# Patient Record
Sex: Female | Born: 1960 | Race: Black or African American | Hispanic: No
Health system: Southern US, Community
[De-identification: ages and names within clinical notes are randomized; demographics above are authoritative.]

---

## 1998-05-27 ENCOUNTER — Inpatient Hospital Stay (HOSPITAL_COMMUNITY): Admission: AD | Admit: 1998-05-27 | Discharge: 1998-05-27 | Payer: Self-pay | Admitting: *Deleted

## 1998-05-30 ENCOUNTER — Ambulatory Visit (HOSPITAL_COMMUNITY): Admission: RE | Admit: 1998-05-30 | Discharge: 1998-05-30 | Payer: Self-pay | Admitting: *Deleted

## 1998-09-08 ENCOUNTER — Emergency Department (HOSPITAL_COMMUNITY): Admission: EM | Admit: 1998-09-08 | Discharge: 1998-09-08 | Payer: Self-pay | Admitting: Emergency Medicine

## 1998-11-09 ENCOUNTER — Ambulatory Visit (HOSPITAL_COMMUNITY): Admission: AD | Admit: 1998-11-09 | Discharge: 1998-11-09 | Payer: Self-pay | Admitting: *Deleted

## 1998-12-26 ENCOUNTER — Inpatient Hospital Stay (HOSPITAL_COMMUNITY): Admission: AD | Admit: 1998-12-26 | Discharge: 1998-12-26 | Payer: Self-pay | Admitting: *Deleted

## 1999-01-03 ENCOUNTER — Encounter: Payer: Self-pay | Admitting: Emergency Medicine

## 1999-01-03 ENCOUNTER — Emergency Department (HOSPITAL_COMMUNITY): Admission: EM | Admit: 1999-01-03 | Discharge: 1999-01-03 | Payer: Self-pay | Admitting: Emergency Medicine

## 1999-02-20 ENCOUNTER — Encounter (INDEPENDENT_AMBULATORY_CARE_PROVIDER_SITE_OTHER): Payer: Self-pay

## 1999-02-20 ENCOUNTER — Inpatient Hospital Stay (HOSPITAL_COMMUNITY): Admission: RE | Admit: 1999-02-20 | Discharge: 1999-02-23 | Payer: Self-pay | Admitting: *Deleted

## 1999-02-21 ENCOUNTER — Encounter: Payer: Self-pay | Admitting: *Deleted

## 1999-03-09 ENCOUNTER — Ambulatory Visit (HOSPITAL_COMMUNITY): Admission: RE | Admit: 1999-03-09 | Discharge: 1999-03-09 | Payer: Self-pay | Admitting: *Deleted

## 1999-03-09 ENCOUNTER — Encounter: Payer: Self-pay | Admitting: *Deleted

## 1999-03-13 ENCOUNTER — Ambulatory Visit: Admission: RE | Admit: 1999-03-13 | Discharge: 1999-03-13 | Payer: Self-pay | Admitting: Gynecology

## 1999-03-19 ENCOUNTER — Encounter: Admission: RE | Admit: 1999-03-19 | Discharge: 1999-06-17 | Payer: Self-pay | Admitting: Radiation Oncology

## 1999-06-20 ENCOUNTER — Ambulatory Visit (HOSPITAL_COMMUNITY): Admission: RE | Admit: 1999-06-20 | Discharge: 1999-06-20 | Payer: Self-pay | Admitting: Hematology & Oncology

## 1999-06-20 ENCOUNTER — Encounter: Payer: Self-pay | Admitting: Hematology & Oncology

## 1999-08-06 ENCOUNTER — Encounter: Admission: RE | Admit: 1999-08-06 | Discharge: 1999-08-06 | Payer: Self-pay | Admitting: Hematology & Oncology

## 1999-08-06 ENCOUNTER — Encounter: Payer: Self-pay | Admitting: Hematology & Oncology

## 1999-08-14 ENCOUNTER — Other Ambulatory Visit: Admission: RE | Admit: 1999-08-14 | Discharge: 1999-08-14 | Payer: Self-pay | Admitting: Gynecologic Oncology

## 1999-08-14 ENCOUNTER — Ambulatory Visit: Admission: RE | Admit: 1999-08-14 | Discharge: 1999-08-14 | Payer: Self-pay | Admitting: Gynecologic Oncology

## 1999-11-08 ENCOUNTER — Ambulatory Visit (HOSPITAL_COMMUNITY): Admission: RE | Admit: 1999-11-08 | Discharge: 1999-11-08 | Payer: Self-pay | Admitting: Hematology & Oncology

## 1999-11-08 ENCOUNTER — Encounter: Payer: Self-pay | Admitting: Hematology & Oncology

## 1999-12-20 ENCOUNTER — Emergency Department (HOSPITAL_COMMUNITY): Admission: EM | Admit: 1999-12-20 | Discharge: 1999-12-20 | Payer: Self-pay | Admitting: Emergency Medicine

## 1999-12-21 ENCOUNTER — Emergency Department (HOSPITAL_COMMUNITY): Admission: EM | Admit: 1999-12-21 | Discharge: 1999-12-21 | Payer: Self-pay | Admitting: Emergency Medicine

## 2000-01-03 ENCOUNTER — Encounter: Admission: RE | Admit: 2000-01-03 | Discharge: 2000-01-03 | Payer: Self-pay | Admitting: Hematology & Oncology

## 2000-01-03 ENCOUNTER — Encounter: Payer: Self-pay | Admitting: Hematology & Oncology

## 2000-01-16 ENCOUNTER — Ambulatory Visit: Admission: RE | Admit: 2000-01-16 | Discharge: 2000-01-16 | Payer: Self-pay | Admitting: Gynecologic Oncology

## 2000-01-16 ENCOUNTER — Other Ambulatory Visit: Admission: RE | Admit: 2000-01-16 | Discharge: 2000-01-16 | Payer: Self-pay | Admitting: Gynecologic Oncology

## 2000-01-31 ENCOUNTER — Ambulatory Visit (HOSPITAL_COMMUNITY): Admission: RE | Admit: 2000-01-31 | Discharge: 2000-01-31 | Payer: Self-pay | Admitting: *Deleted

## 2000-04-22 ENCOUNTER — Ambulatory Visit (HOSPITAL_COMMUNITY): Admission: RE | Admit: 2000-04-22 | Discharge: 2000-04-22 | Payer: Self-pay | Admitting: Hematology & Oncology

## 2000-04-29 ENCOUNTER — Ambulatory Visit (HOSPITAL_COMMUNITY): Admission: RE | Admit: 2000-04-29 | Discharge: 2000-04-29 | Payer: Self-pay | Admitting: Hematology & Oncology

## 2000-04-29 ENCOUNTER — Encounter: Payer: Self-pay | Admitting: Hematology & Oncology

## 2000-05-06 ENCOUNTER — Other Ambulatory Visit: Admission: RE | Admit: 2000-05-06 | Discharge: 2000-05-06 | Payer: Self-pay | Admitting: Gynecologic Oncology

## 2000-05-06 ENCOUNTER — Ambulatory Visit: Admission: RE | Admit: 2000-05-06 | Discharge: 2000-05-06 | Payer: Self-pay | Admitting: Gynecology

## 2000-09-10 ENCOUNTER — Ambulatory Visit: Admission: RE | Admit: 2000-09-10 | Discharge: 2000-09-10 | Payer: Self-pay | Admitting: Gynecology

## 2000-09-10 ENCOUNTER — Other Ambulatory Visit: Admission: RE | Admit: 2000-09-10 | Discharge: 2000-09-10 | Payer: Self-pay | Admitting: Gynecology

## 2001-01-27 ENCOUNTER — Encounter: Payer: Self-pay | Admitting: Emergency Medicine

## 2001-01-27 ENCOUNTER — Emergency Department (HOSPITAL_COMMUNITY): Admission: EM | Admit: 2001-01-27 | Discharge: 2001-01-27 | Payer: Self-pay | Admitting: Emergency Medicine

## 2001-02-18 ENCOUNTER — Other Ambulatory Visit: Admission: RE | Admit: 2001-02-18 | Discharge: 2001-02-18 | Payer: Self-pay | Admitting: Obstetrics and Gynecology

## 2001-03-06 ENCOUNTER — Encounter: Admission: RE | Admit: 2001-03-06 | Discharge: 2001-03-06 | Payer: Self-pay | Admitting: Nephrology

## 2001-03-06 ENCOUNTER — Encounter: Payer: Self-pay | Admitting: Nephrology

## 2001-03-11 ENCOUNTER — Ambulatory Visit: Admission: RE | Admit: 2001-03-11 | Discharge: 2001-03-11 | Payer: Self-pay | Admitting: Gynecologic Oncology

## 2001-03-11 ENCOUNTER — Encounter (INDEPENDENT_AMBULATORY_CARE_PROVIDER_SITE_OTHER): Payer: Self-pay | Admitting: *Deleted

## 2001-03-11 ENCOUNTER — Other Ambulatory Visit: Admission: RE | Admit: 2001-03-11 | Discharge: 2001-03-11 | Payer: Self-pay | Admitting: Gynecologic Oncology

## 2001-04-01 ENCOUNTER — Emergency Department (HOSPITAL_COMMUNITY): Admission: EM | Admit: 2001-04-01 | Discharge: 2001-04-02 | Payer: Self-pay | Admitting: Emergency Medicine

## 2001-06-10 ENCOUNTER — Encounter (INDEPENDENT_AMBULATORY_CARE_PROVIDER_SITE_OTHER): Payer: Self-pay | Admitting: *Deleted

## 2001-06-10 ENCOUNTER — Ambulatory Visit: Admission: RE | Admit: 2001-06-10 | Discharge: 2001-06-10 | Payer: Self-pay | Admitting: Gynecologic Oncology

## 2001-06-10 ENCOUNTER — Other Ambulatory Visit: Admission: RE | Admit: 2001-06-10 | Discharge: 2001-06-10 | Payer: Self-pay | Admitting: Gynecologic Oncology

## 2001-06-19 ENCOUNTER — Emergency Department (HOSPITAL_COMMUNITY): Admission: EM | Admit: 2001-06-19 | Discharge: 2001-06-19 | Payer: Self-pay | Admitting: Emergency Medicine

## 2001-08-21 ENCOUNTER — Ambulatory Visit (HOSPITAL_COMMUNITY): Admission: RE | Admit: 2001-08-21 | Discharge: 2001-08-21 | Payer: Self-pay | Admitting: Nephrology

## 2001-08-21 ENCOUNTER — Encounter: Payer: Self-pay | Admitting: Nephrology

## 2001-08-31 ENCOUNTER — Encounter: Payer: Self-pay | Admitting: Nephrology

## 2001-08-31 ENCOUNTER — Ambulatory Visit (HOSPITAL_COMMUNITY): Admission: RE | Admit: 2001-08-31 | Discharge: 2001-08-31 | Payer: Self-pay | Admitting: Nephrology

## 2001-09-01 ENCOUNTER — Encounter (INDEPENDENT_AMBULATORY_CARE_PROVIDER_SITE_OTHER): Payer: Self-pay | Admitting: *Deleted

## 2001-09-01 ENCOUNTER — Ambulatory Visit: Admission: RE | Admit: 2001-09-01 | Discharge: 2001-09-01 | Payer: Self-pay | Admitting: Gynecology

## 2001-09-01 ENCOUNTER — Other Ambulatory Visit: Admission: RE | Admit: 2001-09-01 | Discharge: 2001-09-01 | Payer: Self-pay | Admitting: Gynecology

## 2002-03-11 ENCOUNTER — Encounter: Payer: Self-pay | Admitting: Nephrology

## 2002-03-11 ENCOUNTER — Ambulatory Visit (HOSPITAL_COMMUNITY): Admission: RE | Admit: 2002-03-11 | Discharge: 2002-03-11 | Payer: Self-pay | Admitting: Nephrology

## 2002-03-17 ENCOUNTER — Ambulatory Visit: Admission: RE | Admit: 2002-03-17 | Discharge: 2002-03-17 | Payer: Self-pay | Admitting: Gynecologic Oncology

## 2002-03-17 ENCOUNTER — Other Ambulatory Visit: Admission: RE | Admit: 2002-03-17 | Discharge: 2002-03-17 | Payer: Self-pay | Admitting: Obstetrics and Gynecology

## 2002-09-14 ENCOUNTER — Encounter (INDEPENDENT_AMBULATORY_CARE_PROVIDER_SITE_OTHER): Payer: Self-pay | Admitting: Specialist

## 2002-09-14 ENCOUNTER — Ambulatory Visit: Admission: RE | Admit: 2002-09-14 | Discharge: 2002-09-14 | Payer: Self-pay | Admitting: Gynecology

## 2002-09-14 ENCOUNTER — Other Ambulatory Visit: Admission: RE | Admit: 2002-09-14 | Discharge: 2002-09-14 | Payer: Self-pay | Admitting: Gynecology

## 2002-10-19 ENCOUNTER — Ambulatory Visit: Admission: RE | Admit: 2002-10-19 | Discharge: 2002-10-19 | Payer: Self-pay | Admitting: Gynecology

## 2002-11-26 ENCOUNTER — Encounter: Payer: Self-pay | Admitting: Nephrology

## 2002-11-26 ENCOUNTER — Encounter: Admission: RE | Admit: 2002-11-26 | Discharge: 2002-11-26 | Payer: Self-pay | Admitting: Nephrology

## 2003-01-08 ENCOUNTER — Encounter: Payer: Self-pay | Admitting: Emergency Medicine

## 2003-01-08 ENCOUNTER — Emergency Department (HOSPITAL_COMMUNITY): Admission: EM | Admit: 2003-01-08 | Discharge: 2003-01-08 | Payer: Self-pay | Admitting: Emergency Medicine

## 2003-01-10 ENCOUNTER — Encounter: Payer: Self-pay | Admitting: Emergency Medicine

## 2003-01-10 ENCOUNTER — Encounter (INDEPENDENT_AMBULATORY_CARE_PROVIDER_SITE_OTHER): Payer: Self-pay | Admitting: Cardiology

## 2003-01-10 ENCOUNTER — Observation Stay (HOSPITAL_COMMUNITY): Admission: EM | Admit: 2003-01-10 | Discharge: 2003-01-11 | Payer: Self-pay

## 2003-01-11 ENCOUNTER — Encounter: Payer: Self-pay | Admitting: Nephrology

## 2003-02-09 ENCOUNTER — Inpatient Hospital Stay (HOSPITAL_COMMUNITY): Admission: EM | Admit: 2003-02-09 | Discharge: 2003-02-11 | Payer: Self-pay | Admitting: Emergency Medicine

## 2003-02-10 ENCOUNTER — Encounter (INDEPENDENT_AMBULATORY_CARE_PROVIDER_SITE_OTHER): Payer: Self-pay | Admitting: *Deleted

## 2003-02-10 ENCOUNTER — Encounter (HOSPITAL_BASED_OUTPATIENT_CLINIC_OR_DEPARTMENT_OTHER): Payer: Self-pay | Admitting: General Surgery

## 2003-05-02 ENCOUNTER — Other Ambulatory Visit: Admission: RE | Admit: 2003-05-02 | Discharge: 2003-05-02 | Payer: Self-pay | Admitting: Obstetrics and Gynecology

## 2003-05-17 ENCOUNTER — Other Ambulatory Visit: Admission: RE | Admit: 2003-05-17 | Discharge: 2003-05-17 | Payer: Self-pay | Admitting: Radiology

## 2003-05-17 ENCOUNTER — Encounter (INDEPENDENT_AMBULATORY_CARE_PROVIDER_SITE_OTHER): Payer: Self-pay | Admitting: Radiology

## 2003-05-31 ENCOUNTER — Ambulatory Visit (HOSPITAL_COMMUNITY): Admission: RE | Admit: 2003-05-31 | Discharge: 2003-05-31 | Payer: Self-pay | Admitting: General Surgery

## 2003-05-31 ENCOUNTER — Encounter (INDEPENDENT_AMBULATORY_CARE_PROVIDER_SITE_OTHER): Payer: Self-pay | Admitting: *Deleted

## 2003-06-08 ENCOUNTER — Encounter (INDEPENDENT_AMBULATORY_CARE_PROVIDER_SITE_OTHER): Payer: Self-pay

## 2003-06-08 ENCOUNTER — Ambulatory Visit: Admission: RE | Admit: 2003-06-08 | Discharge: 2003-06-08 | Payer: Self-pay | Admitting: Gynecologic Oncology

## 2003-07-14 ENCOUNTER — Encounter: Admission: RE | Admit: 2003-07-14 | Discharge: 2003-07-14 | Payer: Self-pay | Admitting: Oncology

## 2003-07-19 ENCOUNTER — Ambulatory Visit (HOSPITAL_COMMUNITY): Admission: RE | Admit: 2003-07-19 | Discharge: 2003-07-19 | Payer: Self-pay | Admitting: General Surgery

## 2003-07-29 ENCOUNTER — Encounter: Admission: RE | Admit: 2003-07-29 | Discharge: 2003-07-29 | Payer: Self-pay | Admitting: General Surgery

## 2003-08-03 ENCOUNTER — Ambulatory Visit (HOSPITAL_COMMUNITY): Admission: RE | Admit: 2003-08-03 | Discharge: 2003-08-03 | Payer: Self-pay | Admitting: Oncology

## 2003-08-11 ENCOUNTER — Ambulatory Visit: Admission: RE | Admit: 2003-08-11 | Discharge: 2003-08-11 | Payer: Self-pay | Admitting: Oncology

## 2003-11-22 ENCOUNTER — Ambulatory Visit: Admission: RE | Admit: 2003-11-22 | Discharge: 2004-02-20 | Payer: Self-pay | Admitting: Radiation Oncology

## 2003-12-06 ENCOUNTER — Other Ambulatory Visit: Admission: RE | Admit: 2003-12-06 | Discharge: 2003-12-06 | Payer: Self-pay | Admitting: Obstetrics and Gynecology

## 2003-12-07 ENCOUNTER — Ambulatory Visit: Admission: RE | Admit: 2003-12-07 | Discharge: 2003-12-07 | Payer: Self-pay | Admitting: Oncology

## 2004-03-23 ENCOUNTER — Ambulatory Visit (HOSPITAL_COMMUNITY): Admission: RE | Admit: 2004-03-23 | Discharge: 2004-03-23 | Payer: Self-pay | Admitting: Oncology

## 2004-03-27 ENCOUNTER — Ambulatory Visit: Admission: RE | Admit: 2004-03-27 | Discharge: 2004-03-27 | Payer: Self-pay | Admitting: Radiation Oncology

## 2004-04-03 ENCOUNTER — Ambulatory Visit (HOSPITAL_COMMUNITY): Admission: RE | Admit: 2004-04-03 | Discharge: 2004-04-03 | Payer: Self-pay | Admitting: Oncology

## 2004-04-11 ENCOUNTER — Encounter (INDEPENDENT_AMBULATORY_CARE_PROVIDER_SITE_OTHER): Payer: Self-pay | Admitting: *Deleted

## 2004-04-11 ENCOUNTER — Ambulatory Visit (HOSPITAL_COMMUNITY): Admission: RE | Admit: 2004-04-11 | Discharge: 2004-04-11 | Payer: Self-pay | Admitting: Gynecology

## 2004-04-23 ENCOUNTER — Ambulatory Visit: Admission: RE | Admit: 2004-04-23 | Discharge: 2004-04-23 | Payer: Self-pay | Admitting: Gynecologic Oncology

## 2004-05-16 ENCOUNTER — Ambulatory Visit (HOSPITAL_COMMUNITY): Admission: RE | Admit: 2004-05-16 | Discharge: 2004-05-16 | Payer: Self-pay | Admitting: Oncology

## 2004-05-22 ENCOUNTER — Ambulatory Visit: Payer: Self-pay | Admitting: Oncology

## 2004-06-20 ENCOUNTER — Ambulatory Visit (HOSPITAL_COMMUNITY): Admission: RE | Admit: 2004-06-20 | Discharge: 2004-06-20 | Payer: Self-pay | Admitting: Oncology

## 2004-07-11 ENCOUNTER — Ambulatory Visit: Payer: Self-pay | Admitting: Oncology

## 2004-07-20 ENCOUNTER — Emergency Department (HOSPITAL_COMMUNITY): Admission: EM | Admit: 2004-07-20 | Discharge: 2004-07-20 | Payer: Self-pay | Admitting: Family Medicine

## 2004-07-25 ENCOUNTER — Ambulatory Visit (HOSPITAL_COMMUNITY): Admission: RE | Admit: 2004-07-25 | Discharge: 2004-07-25 | Payer: Self-pay | Admitting: Oncology

## 2004-07-31 ENCOUNTER — Ambulatory Visit: Admission: RE | Admit: 2004-07-31 | Discharge: 2004-07-31 | Payer: Self-pay | Admitting: Radiation Oncology

## 2004-08-01 ENCOUNTER — Emergency Department (HOSPITAL_COMMUNITY): Admission: EM | Admit: 2004-08-01 | Discharge: 2004-08-02 | Payer: Self-pay | Admitting: Emergency Medicine

## 2004-08-10 ENCOUNTER — Ambulatory Visit (HOSPITAL_COMMUNITY): Admission: RE | Admit: 2004-08-10 | Discharge: 2004-08-10 | Payer: Self-pay | Admitting: Oncology

## 2004-08-23 ENCOUNTER — Ambulatory Visit (HOSPITAL_COMMUNITY): Admission: RE | Admit: 2004-08-23 | Discharge: 2004-08-23 | Payer: Self-pay | Admitting: Oncology

## 2004-08-28 ENCOUNTER — Ambulatory Visit: Payer: Self-pay | Admitting: Oncology

## 2004-09-01 ENCOUNTER — Ambulatory Visit (HOSPITAL_COMMUNITY): Admission: RE | Admit: 2004-09-01 | Discharge: 2004-09-01 | Payer: Self-pay | Admitting: Oncology

## 2004-09-03 ENCOUNTER — Inpatient Hospital Stay (HOSPITAL_COMMUNITY): Admission: EM | Admit: 2004-09-03 | Discharge: 2004-09-14 | Payer: Self-pay | Admitting: Oncology

## 2004-09-03 ENCOUNTER — Ambulatory Visit: Payer: Self-pay | Admitting: Physical Medicine & Rehabilitation

## 2004-09-03 ENCOUNTER — Ambulatory Visit: Payer: Self-pay | Admitting: Oncology

## 2004-09-11 ENCOUNTER — Encounter (INDEPENDENT_AMBULATORY_CARE_PROVIDER_SITE_OTHER): Payer: Self-pay | Admitting: *Deleted

## 2004-09-14 ENCOUNTER — Inpatient Hospital Stay (HOSPITAL_COMMUNITY)
Admission: RE | Admit: 2004-09-14 | Discharge: 2004-09-19 | Payer: Self-pay | Admitting: Physical Medicine & Rehabilitation

## 2004-09-25 ENCOUNTER — Ambulatory Visit: Admission: RE | Admit: 2004-09-25 | Discharge: 2004-09-25 | Payer: Self-pay | Admitting: Gynecologic Oncology

## 2004-10-01 ENCOUNTER — Ambulatory Visit (HOSPITAL_COMMUNITY): Admission: RE | Admit: 2004-10-01 | Discharge: 2004-10-01 | Payer: Self-pay | Admitting: Oncology

## 2004-10-18 ENCOUNTER — Ambulatory Visit: Payer: Self-pay | Admitting: Oncology

## 2004-11-06 ENCOUNTER — Encounter: Admission: RE | Admit: 2004-11-06 | Discharge: 2004-11-06 | Payer: Self-pay | Admitting: Nephrology

## 2004-11-27 ENCOUNTER — Other Ambulatory Visit: Admission: RE | Admit: 2004-11-27 | Discharge: 2004-11-27 | Payer: Self-pay | Admitting: Obstetrics and Gynecology

## 2004-12-04 ENCOUNTER — Ambulatory Visit: Payer: Self-pay | Admitting: Oncology

## 2004-12-13 ENCOUNTER — Ambulatory Visit: Payer: Self-pay | Admitting: Oncology

## 2004-12-13 ENCOUNTER — Ambulatory Visit (HOSPITAL_COMMUNITY): Admission: RE | Admit: 2004-12-13 | Discharge: 2004-12-13 | Payer: Self-pay | Admitting: Oncology

## 2004-12-13 ENCOUNTER — Encounter (INDEPENDENT_AMBULATORY_CARE_PROVIDER_SITE_OTHER): Payer: Self-pay | Admitting: *Deleted

## 2005-02-06 ENCOUNTER — Ambulatory Visit (HOSPITAL_COMMUNITY): Admission: RE | Admit: 2005-02-06 | Discharge: 2005-02-06 | Payer: Self-pay | Admitting: Oncology

## 2005-02-12 ENCOUNTER — Emergency Department (HOSPITAL_COMMUNITY): Admission: EM | Admit: 2005-02-12 | Discharge: 2005-02-13 | Payer: Self-pay | Admitting: Emergency Medicine

## 2005-02-18 ENCOUNTER — Ambulatory Visit: Payer: Self-pay | Admitting: Oncology

## 2005-02-19 ENCOUNTER — Ambulatory Visit: Admission: RE | Admit: 2005-02-19 | Discharge: 2005-02-19 | Payer: Self-pay | Admitting: Gynecologic Oncology

## 2005-04-03 ENCOUNTER — Ambulatory Visit (HOSPITAL_COMMUNITY): Admission: RE | Admit: 2005-04-03 | Discharge: 2005-04-03 | Payer: Self-pay | Admitting: General Surgery

## 2005-04-03 ENCOUNTER — Ambulatory Visit (HOSPITAL_BASED_OUTPATIENT_CLINIC_OR_DEPARTMENT_OTHER): Admission: RE | Admit: 2005-04-03 | Discharge: 2005-04-03 | Payer: Self-pay | Admitting: General Surgery

## 2005-04-08 ENCOUNTER — Ambulatory Visit: Payer: Self-pay | Admitting: Oncology

## 2005-05-03 ENCOUNTER — Ambulatory Visit (HOSPITAL_BASED_OUTPATIENT_CLINIC_OR_DEPARTMENT_OTHER): Admission: RE | Admit: 2005-05-03 | Discharge: 2005-05-03 | Payer: Self-pay | Admitting: General Surgery

## 2005-05-03 ENCOUNTER — Ambulatory Visit (HOSPITAL_COMMUNITY): Admission: RE | Admit: 2005-05-03 | Discharge: 2005-05-03 | Payer: Self-pay | Admitting: General Surgery

## 2005-05-07 ENCOUNTER — Ambulatory Visit (HOSPITAL_COMMUNITY): Admission: RE | Admit: 2005-05-07 | Discharge: 2005-05-07 | Payer: Self-pay | Admitting: General Surgery

## 2005-05-24 ENCOUNTER — Ambulatory Visit: Payer: Self-pay | Admitting: Oncology

## 2005-05-28 ENCOUNTER — Ambulatory Visit (HOSPITAL_COMMUNITY): Admission: RE | Admit: 2005-05-28 | Discharge: 2005-05-28 | Payer: Self-pay | Admitting: Oncology

## 2005-05-30 ENCOUNTER — Ambulatory Visit (HOSPITAL_COMMUNITY): Admission: RE | Admit: 2005-05-30 | Discharge: 2005-05-30 | Payer: Self-pay | Admitting: Oncology

## 2005-06-10 ENCOUNTER — Ambulatory Visit: Payer: Self-pay | Admitting: Internal Medicine

## 2005-06-21 ENCOUNTER — Ambulatory Visit: Payer: Self-pay | Admitting: Internal Medicine

## 2005-07-25 ENCOUNTER — Ambulatory Visit: Payer: Self-pay | Admitting: Oncology

## 2005-08-02 ENCOUNTER — Ambulatory Visit (HOSPITAL_COMMUNITY): Admission: RE | Admit: 2005-08-02 | Discharge: 2005-08-02 | Payer: Self-pay | Admitting: Oncology

## 2005-09-19 ENCOUNTER — Ambulatory Visit (HOSPITAL_COMMUNITY): Admission: RE | Admit: 2005-09-19 | Discharge: 2005-09-19 | Payer: Self-pay | Admitting: Oncology

## 2005-09-19 ENCOUNTER — Ambulatory Visit: Payer: Self-pay | Admitting: Oncology

## 2005-10-07 ENCOUNTER — Encounter: Admission: RE | Admit: 2005-10-07 | Discharge: 2005-10-07 | Payer: Self-pay | Admitting: Nephrology

## 2005-10-14 ENCOUNTER — Ambulatory Visit (HOSPITAL_COMMUNITY): Admission: RE | Admit: 2005-10-14 | Discharge: 2005-10-14 | Payer: Self-pay | Admitting: Oncology

## 2005-11-01 LAB — CBC WITH DIFFERENTIAL/PLATELET
BASO%: 0.7 % (ref 0.0–2.0)
EOS%: 1.1 % (ref 0.0–7.0)
HCT: 32.4 % — ABNORMAL LOW (ref 34.8–46.6)
LYMPH%: 45.7 % (ref 14.0–48.0)
MCH: 33.5 pg (ref 26.0–34.0)
MCHC: 33.6 g/dL (ref 32.0–36.0)
MONO#: 0.1 10*3/uL (ref 0.1–0.9)
NEUT%: 45.9 % (ref 39.6–76.8)
RBC: 3.25 10*6/uL — ABNORMAL LOW (ref 3.70–5.32)
WBC: 2.2 10*3/uL — ABNORMAL LOW (ref 3.9–10.0)
lymph#: 1 10*3/uL (ref 0.9–3.3)

## 2005-11-04 LAB — COMPREHENSIVE METABOLIC PANEL
ALT: 19 U/L (ref 0–40)
AST: 21 U/L (ref 0–37)
CO2: 25 mEq/L (ref 19–32)
Chloride: 106 mEq/L (ref 96–112)
Creatinine, Ser: 0.8 mg/dL (ref 0.4–1.2)
Sodium: 142 mEq/L (ref 135–145)
Total Bilirubin: 0.7 mg/dL (ref 0.3–1.2)
Total Protein: 7.6 g/dL (ref 6.0–8.3)

## 2005-12-24 ENCOUNTER — Ambulatory Visit: Payer: Self-pay | Admitting: Oncology

## 2006-02-04 ENCOUNTER — Emergency Department (HOSPITAL_COMMUNITY): Admission: EM | Admit: 2006-02-04 | Discharge: 2006-02-04 | Payer: Self-pay | Admitting: Emergency Medicine

## 2006-02-06 LAB — URINALYSIS, MICROSCOPIC - CHCC
Bilirubin (Urine): NEGATIVE
Leukocyte Esterase: NEGATIVE
pH: 5 (ref 4.6–8.0)

## 2006-02-20 ENCOUNTER — Ambulatory Visit: Payer: Self-pay | Admitting: Oncology

## 2006-02-20 ENCOUNTER — Ambulatory Visit (HOSPITAL_COMMUNITY): Admission: RE | Admit: 2006-02-20 | Discharge: 2006-02-20 | Payer: Self-pay | Admitting: Oncology

## 2006-02-20 LAB — URINALYSIS, MICROSCOPIC - CHCC
Ketones: NEGATIVE mg/dL
Leukocyte Esterase: NEGATIVE
Nitrite: NEGATIVE
Specific Gravity, Urine: 1.015 (ref 1.003–1.035)
pH: 6.5 (ref 4.6–8.0)

## 2006-02-24 ENCOUNTER — Ambulatory Visit (HOSPITAL_COMMUNITY): Admission: RE | Admit: 2006-02-24 | Discharge: 2006-02-24 | Payer: Self-pay | Admitting: Urology

## 2006-02-25 LAB — CBC WITH DIFFERENTIAL/PLATELET
Basophils Absolute: 0 10*3/uL (ref 0.0–0.1)
EOS%: 1 % (ref 0.0–7.0)
Eosinophils Absolute: 0 10*3/uL (ref 0.0–0.5)
HGB: 8.4 g/dL — ABNORMAL LOW (ref 11.6–15.9)
MONO#: 0.1 10*3/uL (ref 0.1–0.9)
NEUT#: 1.2 10*3/uL — ABNORMAL LOW (ref 1.5–6.5)
RDW: 14.8 % — ABNORMAL HIGH (ref 11.3–14.5)
WBC: 2 10*3/uL — ABNORMAL LOW (ref 3.9–10.0)
lymph#: 0.7 10*3/uL — ABNORMAL LOW (ref 0.9–3.3)

## 2006-03-21 LAB — URINALYSIS, MICROSCOPIC - CHCC
Bilirubin (Urine): NEGATIVE
Ketones: NEGATIVE mg/dL
pH: 6.5 (ref 4.6–8.0)

## 2006-04-16 ENCOUNTER — Ambulatory Visit: Payer: Self-pay | Admitting: Oncology

## 2006-04-16 ENCOUNTER — Encounter (HOSPITAL_COMMUNITY): Admission: RE | Admit: 2006-04-16 | Discharge: 2006-07-15 | Payer: Self-pay | Admitting: Oncology

## 2006-04-19 ENCOUNTER — Emergency Department (HOSPITAL_COMMUNITY): Admission: EM | Admit: 2006-04-19 | Discharge: 2006-04-19 | Payer: Self-pay | Admitting: Family Medicine

## 2006-04-21 ENCOUNTER — Encounter: Admission: RE | Admit: 2006-04-21 | Discharge: 2006-04-21 | Payer: Self-pay | Admitting: Nephrology

## 2006-04-25 LAB — BASIC METABOLIC PANEL
CO2: 26 mEq/L (ref 19–32)
Chloride: 108 mEq/L (ref 96–112)
Sodium: 140 mEq/L (ref 135–145)

## 2006-04-28 ENCOUNTER — Ambulatory Visit (HOSPITAL_COMMUNITY): Admission: RE | Admit: 2006-04-28 | Discharge: 2006-04-28 | Payer: Self-pay | Admitting: Oncology

## 2006-05-31 ENCOUNTER — Inpatient Hospital Stay (HOSPITAL_COMMUNITY): Admission: EM | Admit: 2006-05-31 | Discharge: 2006-06-05 | Payer: Self-pay | Admitting: Emergency Medicine

## 2006-06-01 ENCOUNTER — Ambulatory Visit: Payer: Self-pay | Admitting: Oncology

## 2006-06-24 ENCOUNTER — Ambulatory Visit: Payer: Self-pay | Admitting: Oncology

## 2006-06-24 LAB — CBC WITH DIFFERENTIAL/PLATELET
Basophils Absolute: 0 10*3/uL (ref 0.0–0.1)
EOS%: 0.2 % (ref 0.0–7.0)
HCT: 20.8 % — ABNORMAL LOW (ref 34.8–46.6)
HGB: 7.2 g/dL — ABNORMAL LOW (ref 11.6–15.9)
MCH: 32.3 pg (ref 26.0–34.0)
MONO#: 0 10*3/uL — ABNORMAL LOW (ref 0.1–0.9)
NEUT%: 86.7 % — ABNORMAL HIGH (ref 39.6–76.8)
lymph#: 0.6 10*3/uL — ABNORMAL LOW (ref 0.9–3.3)

## 2006-06-24 LAB — BASIC METABOLIC PANEL
BUN: 13 mg/dL (ref 6–23)
CO2: 25 mEq/L (ref 19–32)
Chloride: 107 mEq/L (ref 96–112)
Creatinine, Ser: 0.7 mg/dL (ref 0.40–1.20)
Glucose, Bld: 92 mg/dL (ref 70–99)

## 2006-06-27 LAB — BASIC METABOLIC PANEL
BUN: 12 mg/dL (ref 6–23)
Potassium: 4.1 mEq/L (ref 3.5–5.3)

## 2006-06-27 LAB — CBC WITH DIFFERENTIAL/PLATELET
EOS%: 0.3 % (ref 0.0–7.0)
MCH: 32.2 pg (ref 26.0–34.0)
MCHC: 34.1 g/dL (ref 32.0–36.0)
MCV: 94.5 fL (ref 81.0–101.0)
MONO%: 0.9 % (ref 0.0–13.0)
RBC: 2.14 10*6/uL — ABNORMAL LOW (ref 3.70–5.32)
RDW: 25.9 % — ABNORMAL HIGH (ref 11.3–14.5)

## 2006-06-27 LAB — TECHNOLOGIST REVIEW

## 2006-07-02 LAB — TYPE & CROSSMATCH - CHCC

## 2006-07-09 LAB — CBC WITH DIFFERENTIAL/PLATELET
BASO%: 0 % (ref 0.0–2.0)
Eosinophils Absolute: 0 10*3/uL (ref 0.0–0.5)
HCT: 28.2 % — ABNORMAL LOW (ref 34.8–46.6)
MCHC: 33.9 g/dL (ref 32.0–36.0)
MONO#: 0 10*3/uL — ABNORMAL LOW (ref 0.1–0.9)
NEUT#: 2.7 10*3/uL (ref 1.5–6.5)
NEUT%: 81.3 % — ABNORMAL HIGH (ref 39.6–76.8)
RBC: 3.1 10*6/uL — ABNORMAL LOW (ref 3.70–5.32)
WBC: 3.4 10*3/uL — ABNORMAL LOW (ref 3.9–10.0)
lymph#: 0.6 10*3/uL — ABNORMAL LOW (ref 0.9–3.3)

## 2006-07-24 LAB — CBC WITH DIFFERENTIAL/PLATELET
BASO%: 0 % (ref 0.0–2.0)
EOS%: 0.2 % (ref 0.0–7.0)
HCT: 20.9 % — ABNORMAL LOW (ref 34.8–46.6)
LYMPH%: 14.7 % (ref 14.0–48.0)
MCH: 30.4 pg (ref 26.0–34.0)
MCHC: 33.4 g/dL (ref 32.0–36.0)
MONO%: 0.3 % (ref 0.0–13.0)
NEUT%: 84.8 % — ABNORMAL HIGH (ref 39.6–76.8)
Platelets: 55 10*3/uL — ABNORMAL LOW (ref 145–400)
RBC: 2.3 10*6/uL — ABNORMAL LOW (ref 3.70–5.32)

## 2006-07-24 LAB — TECHNOLOGIST REVIEW: Technologist Review: 3

## 2006-07-24 LAB — BASIC METABOLIC PANEL
BUN: 7 mg/dL (ref 6–23)
Calcium: 9 mg/dL (ref 8.4–10.5)
Creatinine, Ser: 0.74 mg/dL (ref 0.40–1.20)

## 2006-07-25 ENCOUNTER — Encounter (HOSPITAL_COMMUNITY): Admission: RE | Admit: 2006-07-25 | Discharge: 2006-10-23 | Payer: Self-pay | Admitting: Oncology

## 2006-07-28 LAB — TYPE & CROSSMATCH - CHCC

## 2006-08-11 ENCOUNTER — Ambulatory Visit: Payer: Self-pay | Admitting: Oncology

## 2006-08-11 LAB — CBC WITH DIFFERENTIAL/PLATELET
EOS%: 0.6 % (ref 0.0–7.0)
Eosinophils Absolute: 0 10*3/uL (ref 0.0–0.5)
LYMPH%: 31.5 % (ref 14.0–48.0)
MCH: 30.1 pg (ref 26.0–34.0)
MCV: 90.6 fL (ref 81.0–101.0)
MONO%: 7.4 % (ref 0.0–13.0)
Platelets: 81 10*3/uL — ABNORMAL LOW (ref 145–400)
RBC: 2.61 10*6/uL — ABNORMAL LOW (ref 3.70–5.32)
RDW: 19.1 % — ABNORMAL HIGH (ref 11.3–14.5)

## 2006-08-11 LAB — HOLD TUBE, BLOOD BANK

## 2006-08-18 LAB — CBC WITH DIFFERENTIAL/PLATELET
BASO%: 1.6 % (ref 0.0–2.0)
EOS%: 1.7 % (ref 0.0–7.0)
HCT: 22 % — ABNORMAL LOW (ref 34.8–46.6)
LYMPH%: 46.1 % (ref 14.0–48.0)
MCH: 31.7 pg (ref 26.0–34.0)
MCHC: 33.9 g/dL (ref 32.0–36.0)
MONO#: 0.2 10*3/uL (ref 0.1–0.9)
NEUT%: 47.3 % (ref 39.6–76.8)
Platelets: 54 10*3/uL — ABNORMAL LOW (ref 145–400)
RBC: 2.35 10*6/uL — ABNORMAL LOW (ref 3.70–5.32)
WBC: 7.3 10*3/uL (ref 3.9–10.0)
lymph#: 3.3 10*3/uL (ref 0.9–3.3)

## 2006-08-18 LAB — TECHNOLOGIST REVIEW

## 2006-08-25 LAB — CBC WITH DIFFERENTIAL/PLATELET
Basophils Absolute: 0.1 10*3/uL (ref 0.0–0.1)
EOS%: 0.4 % (ref 0.0–7.0)
Eosinophils Absolute: 0 10*3/uL (ref 0.0–0.5)
MCHC: 34.7 g/dL (ref 32.0–36.0)
MONO%: 4.1 % (ref 0.0–13.0)
NEUT%: 50.7 % (ref 39.6–76.8)
Platelets: 57 10*3/uL — ABNORMAL LOW (ref 145–400)
WBC: 5 10*3/uL (ref 3.9–10.0)
lymph#: 2.1 10*3/uL (ref 0.9–3.3)

## 2006-09-01 LAB — CBC WITH DIFFERENTIAL/PLATELET
Basophils Absolute: 0.1 10*3/uL (ref 0.0–0.1)
EOS%: 0.8 % (ref 0.0–7.0)
Eosinophils Absolute: 0 10*3/uL (ref 0.0–0.5)
HGB: 9.1 g/dL — ABNORMAL LOW (ref 11.6–15.9)
LYMPH%: 61.5 % — ABNORMAL HIGH (ref 14.0–48.0)
MCH: 32.5 pg (ref 26.0–34.0)
MCV: 92.8 fL (ref 81.0–101.0)
MONO%: 1.6 % (ref 0.0–13.0)
NEUT#: 2.1 10*3/uL (ref 1.5–6.5)
NEUT%: 35 % — ABNORMAL LOW (ref 39.6–76.8)
Platelets: 69 10*3/uL — ABNORMAL LOW (ref 145–400)
RDW: 19.8 % — ABNORMAL HIGH (ref 11.3–14.5)

## 2006-09-08 LAB — MANUAL DIFFERENTIAL
ANC (CHCC manual diff): 2.5 10*3/uL (ref 1.5–6.5)
Band Neutrophils: 1 % (ref 0–10)
Basophil: 0 % (ref 0–2)
Blasts: 7 % — ABNORMAL HIGH (ref 0–0)
EOS: 1 % (ref 0–7)
Metamyelocytes: 0 % (ref 0–0)
Myelocytes: 0 % (ref 0–0)
PLT EST: DECREASED
PROMYELO: 0 % (ref 0–0)
nRBC: 3 % — ABNORMAL HIGH (ref 0–0)

## 2006-09-08 LAB — CBC WITH DIFFERENTIAL/PLATELET
HGB: 8 g/dL — ABNORMAL LOW (ref 11.6–15.9)
MCH: 32.4 pg (ref 26.0–34.0)
MCHC: 35 g/dL (ref 32.0–36.0)
MCV: 92.6 fL (ref 81.0–101.0)
RBC: 2.46 10*6/uL — ABNORMAL LOW (ref 3.70–5.32)
RDW: 21.1 % — ABNORMAL HIGH (ref 11.3–14.5)

## 2006-09-09 ENCOUNTER — Ambulatory Visit: Payer: Self-pay | Admitting: *Deleted

## 2006-09-09 ENCOUNTER — Ambulatory Visit: Admission: RE | Admit: 2006-09-09 | Discharge: 2006-09-09 | Payer: Self-pay | Admitting: Oncology

## 2006-09-10 ENCOUNTER — Ambulatory Visit: Payer: Self-pay | Admitting: Oncology

## 2006-09-10 ENCOUNTER — Inpatient Hospital Stay (HOSPITAL_COMMUNITY): Admission: EM | Admit: 2006-09-10 | Discharge: 2006-09-12 | Payer: Self-pay | Admitting: Oncology

## 2006-09-10 LAB — CBC WITH DIFFERENTIAL/PLATELET
BASO%: 2.3 % — ABNORMAL HIGH (ref 0.0–2.0)
Basophils Absolute: 0.1 10*3/uL (ref 0.0–0.1)
EOS%: 1.5 % (ref 0.0–7.0)
HCT: 22.6 % — ABNORMAL LOW (ref 34.8–46.6)
HGB: 7.7 g/dL — ABNORMAL LOW (ref 11.6–15.9)
MCH: 31.3 pg (ref 26.0–34.0)
MONO#: 0.4 10*3/uL (ref 0.1–0.9)
RDW: 18.3 % — ABNORMAL HIGH (ref 11.3–14.5)
WBC: 4.8 10*3/uL (ref 3.9–10.0)
lymph#: 1.3 10*3/uL (ref 0.9–3.3)

## 2006-09-10 LAB — COMPREHENSIVE METABOLIC PANEL
ALT: 9 U/L (ref 0–35)
Albumin: 3.8 g/dL (ref 3.5–5.2)
Alkaline Phosphatase: 38 U/L — ABNORMAL LOW (ref 39–117)
CO2: 27 mEq/L (ref 19–32)
Potassium: 3.5 mEq/L (ref 3.5–5.3)
Sodium: 143 mEq/L (ref 135–145)
Total Bilirubin: 0.6 mg/dL (ref 0.3–1.2)
Total Protein: 6.4 g/dL (ref 6.0–8.3)

## 2006-09-15 LAB — CBC WITH DIFFERENTIAL/PLATELET
MCH: 32.3 pg (ref 26.0–34.0)
MCV: 94.1 fL (ref 81.0–101.0)
RBC: 3.05 10*6/uL — ABNORMAL LOW (ref 3.70–5.32)
RDW: 17.1 % — ABNORMAL HIGH (ref 11.3–14.5)
WBC: 6.9 10*3/uL (ref 3.9–10.0)

## 2006-09-15 LAB — MANUAL DIFFERENTIAL
Basophil: 0 % (ref 0–2)
EOS: 1 % (ref 0–7)
Myelocytes: 0 % (ref 0–0)
PLT EST: DECREASED
PROMYELO: 0 % (ref 0–0)

## 2006-09-16 ENCOUNTER — Emergency Department (HOSPITAL_COMMUNITY): Admission: EM | Admit: 2006-09-16 | Discharge: 2006-09-16 | Payer: Self-pay | Admitting: Emergency Medicine

## 2006-09-22 LAB — MANUAL DIFFERENTIAL
ALC: 2.9 10*3/uL (ref 0.9–3.3)
ANC (CHCC manual diff): 2.9 10*3/uL (ref 1.5–6.5)
Band Neutrophils: 2 % (ref 0–10)
Blasts: 22 % — ABNORMAL HIGH (ref 0–0)
LYMPH: 31 % (ref 14–49)
Metamyelocytes: 3 % — ABNORMAL HIGH (ref 0–0)
PLT EST: DECREASED
Variant Lymph: 6 % — ABNORMAL HIGH (ref 0–0)

## 2006-09-22 LAB — CBC WITH DIFFERENTIAL/PLATELET
HCT: 29.4 % — ABNORMAL LOW (ref 34.8–46.6)
HGB: 10.1 g/dL — ABNORMAL LOW (ref 11.6–15.9)
WBC: 7.8 10*3/uL (ref 3.9–10.0)

## 2006-09-25 ENCOUNTER — Inpatient Hospital Stay (HOSPITAL_COMMUNITY): Admission: RE | Admit: 2006-09-25 | Discharge: 2006-09-28 | Payer: Self-pay | Admitting: Urology

## 2006-09-29 ENCOUNTER — Emergency Department (HOSPITAL_COMMUNITY): Admission: EM | Admit: 2006-09-29 | Discharge: 2006-09-29 | Payer: Self-pay | Admitting: Emergency Medicine

## 2006-10-08 ENCOUNTER — Ambulatory Visit: Payer: Self-pay | Admitting: Oncology

## 2006-10-08 LAB — MANUAL DIFFERENTIAL
ALC: 3.4 10*3/uL — ABNORMAL HIGH (ref 0.9–3.3)
MONO: 0 % (ref 0–14)
Metamyelocytes: 2 % — ABNORMAL HIGH (ref 0–0)
Myelocytes: 0 % (ref 0–0)
PLT EST: DECREASED
PROMYELO: 0 % (ref 0–0)
Variant Lymph: 0 % (ref 0–0)

## 2006-10-08 LAB — CBC WITH DIFFERENTIAL/PLATELET
MCHC: 34.3 g/dL (ref 32.0–36.0)
Platelets: 24 10*3/uL — ABNORMAL LOW (ref 145–400)
RBC: 2.23 10*6/uL — ABNORMAL LOW (ref 3.70–5.32)
RDW: 14.9 % — ABNORMAL HIGH (ref 11.3–14.5)

## 2006-10-13 LAB — CBC WITH DIFFERENTIAL/PLATELET
Platelets: 16 10*3/uL — ABNORMAL LOW (ref 145–400)
RBC: 2.02 10*6/uL — ABNORMAL LOW (ref 3.70–5.32)
RDW: 14.9 % — ABNORMAL HIGH (ref 11.3–14.5)
WBC: 31.1 10*3/uL — ABNORMAL HIGH (ref 3.9–10.0)

## 2006-10-13 LAB — MANUAL DIFFERENTIAL
ALC: 3.1 10*3/uL (ref 0.9–3.3)
Myelocytes: 0 % (ref 0–0)
Other Cell: 0 % (ref 0–0)
PROMYELO: 0 % (ref 0–0)
SEG: 11 % — ABNORMAL LOW (ref 38–77)
Variant Lymph: 0 % (ref 0–0)

## 2006-10-17 ENCOUNTER — Encounter: Payer: Self-pay | Admitting: Oncology

## 2006-10-17 ENCOUNTER — Ambulatory Visit (HOSPITAL_COMMUNITY): Admission: RE | Admit: 2006-10-17 | Discharge: 2006-10-17 | Payer: Self-pay | Admitting: Oncology

## 2006-10-17 ENCOUNTER — Encounter (INDEPENDENT_AMBULATORY_CARE_PROVIDER_SITE_OTHER): Payer: Self-pay | Admitting: *Deleted

## 2006-10-22 LAB — CBC WITH DIFFERENTIAL/PLATELET
HCT: 22.5 % — ABNORMAL LOW (ref 34.8–46.6)
MCH: 30.2 pg (ref 26.0–34.0)
MCHC: 34.8 g/dL (ref 32.0–36.0)
MCV: 86.9 fL (ref 81.0–101.0)
Platelets: 8 10*3/uL — CL (ref 145–400)

## 2006-10-22 LAB — MANUAL DIFFERENTIAL
ALC: 1.4 10*3/uL (ref 0.9–3.3)
Basophil: 0 % (ref 0–2)
LYMPH: 3 % — ABNORMAL LOW (ref 14–49)
MONO: 5 % (ref 0–14)
Metamyelocytes: 0 % (ref 0–0)
PLT EST: DECREASED
Variant Lymph: 0 % (ref 0–0)

## 2006-10-24 LAB — MANUAL DIFFERENTIAL
EOS: 0 % (ref 0–7)
LYMPH: 5 % — ABNORMAL LOW (ref 14–49)
MONO: 3 % (ref 0–14)
Myelocytes: 0 % (ref 0–0)
Other Cell: 0 % (ref 0–0)
PLT EST: DECREASED
RBC Comments: NORMAL
SEG: 5 % — ABNORMAL LOW (ref 38–77)
Variant Lymph: 0 % (ref 0–0)

## 2006-10-24 LAB — CBC WITH DIFFERENTIAL/PLATELET
HCT: 22.6 % — ABNORMAL LOW (ref 34.8–46.6)
MCHC: 33.8 g/dL (ref 32.0–36.0)
MCV: 87 fL (ref 81.0–101.0)
Platelets: 41 10*3/uL — ABNORMAL LOW (ref 145–400)
RBC: 2.6 10*6/uL — ABNORMAL LOW (ref 3.70–5.32)

## 2006-10-28 ENCOUNTER — Encounter (HOSPITAL_COMMUNITY): Admission: RE | Admit: 2006-10-28 | Discharge: 2007-01-26 | Payer: Self-pay | Admitting: Oncology

## 2006-10-28 LAB — MANUAL DIFFERENTIAL
ALC: 7.6 10*3/uL — ABNORMAL HIGH (ref 0.9–3.3)
ANC (CHCC manual diff): 6 10*3/uL (ref 1.5–6.5)
Blasts: 74 % — ABNORMAL HIGH (ref 0–0)
MONO: 1 % (ref 0–14)
Metamyelocytes: 3 % — ABNORMAL HIGH (ref 0–0)
Myelocytes: 0 % (ref 0–0)
Other Cell: 0 % (ref 0–0)
PROMYELO: 0 % (ref 0–0)
SEG: 8 % — ABNORMAL LOW (ref 38–77)
Variant Lymph: 0 % (ref 0–0)

## 2006-10-28 LAB — CBC WITH DIFFERENTIAL/PLATELET
MCHC: 34.3 g/dL (ref 32.0–36.0)
Platelets: 24 10*3/uL — ABNORMAL LOW (ref 145–400)
RBC: 2.25 10*6/uL — ABNORMAL LOW (ref 3.70–5.32)
RDW: 15.3 % — ABNORMAL HIGH (ref 11.3–14.5)
WBC: 54.3 10*3/uL (ref 3.9–10.0)

## 2006-10-30 LAB — MANUAL DIFFERENTIAL
ALC: 13.1 10*3/uL — ABNORMAL HIGH (ref 0.9–3.3)
EOS: 0 % (ref 0–7)
MONO: 0 % (ref 0–14)
Metamyelocytes: 0 % (ref 0–0)
Myelocytes: 0 % (ref 0–0)
Other Cell: 0 % (ref 0–0)
PLT EST: DECREASED
PROMYELO: 0 % (ref 0–0)
SEG: 6 % — ABNORMAL LOW (ref 38–77)

## 2006-10-30 LAB — TYPE & CROSSMATCH - CHCC

## 2006-10-30 LAB — CBC WITH DIFFERENTIAL/PLATELET
HGB: 6.2 g/dL — CL (ref 11.6–15.9)
MCH: 29.8 pg (ref 26.0–34.0)
RBC: 2.08 10*6/uL — ABNORMAL LOW (ref 3.70–5.32)
RDW: 15 % — ABNORMAL HIGH (ref 11.3–14.5)
WBC: 48.4 10*3/uL — ABNORMAL HIGH (ref 3.9–10.0)

## 2006-11-03 LAB — CBC WITH DIFFERENTIAL/PLATELET
HCT: 25.5 % — ABNORMAL LOW (ref 34.8–46.6)
HGB: 8.7 g/dL — ABNORMAL LOW (ref 11.6–15.9)
MCV: 88.9 fL (ref 81.0–101.0)
Platelets: 9 10*3/uL — CL (ref 145–400)
RBC: 2.87 10*6/uL — ABNORMAL LOW (ref 3.70–5.32)
WBC: 51.3 10*3/uL (ref 3.9–10.0)

## 2006-11-03 LAB — MANUAL DIFFERENTIAL
ALC: 2.6 10*3/uL (ref 0.9–3.3)
Band Neutrophils: 0 % (ref 0–10)
EOS: 0 % (ref 0–7)
LYMPH: 5 % — ABNORMAL LOW (ref 14–49)
MONO: 0 % (ref 0–14)
Myelocytes: 0 % (ref 0–0)
Other Cell: 0 % (ref 0–0)
PLT EST: DECREASED
SEG: 2 % — ABNORMAL LOW (ref 38–77)
Variant Lymph: 0 % (ref 0–0)
nRBC: 0 % (ref 0–0)

## 2006-11-03 LAB — HOLD TUBE, BLOOD BANK

## 2006-11-07 LAB — MANUAL DIFFERENTIAL
ALC: 2.5 10*3/uL (ref 0.9–3.3)
EOS: 0 % (ref 0–7)
LYMPH: 4 % — ABNORMAL LOW (ref 14–49)
MONO: 0 % (ref 0–14)
Metamyelocytes: 0 % (ref 0–0)
Myelocytes: 1 % — ABNORMAL HIGH (ref 0–0)
Other Cell: 0 % (ref 0–0)
PLT EST: DECREASED
SEG: 4 % — ABNORMAL LOW (ref 38–77)
Variant Lymph: 0 % (ref 0–0)

## 2006-11-07 LAB — CBC WITH DIFFERENTIAL/PLATELET
HCT: 23.6 % — ABNORMAL LOW (ref 34.8–46.6)
MCHC: 33.3 g/dL (ref 32.0–36.0)
MCV: 86.3 fL (ref 81.0–101.0)
Platelets: 10 10*3/uL — ABNORMAL LOW (ref 145–400)
RBC: 2.73 10*6/uL — ABNORMAL LOW (ref 3.70–5.32)

## 2006-11-10 LAB — MANUAL DIFFERENTIAL
ALC: 4.6 10*3/uL — ABNORMAL HIGH (ref 0.9–3.3)
ANC (CHCC manual diff): 0 10*3/uL — CL (ref 1.5–6.5)
Blasts: 93 % — ABNORMAL HIGH (ref 0–0)
Metamyelocytes: 0 % (ref 0–0)
Myelocytes: 0 % (ref 0–0)
Other Cell: 0 % (ref 0–0)
PROMYELO: 0 % (ref 0–0)
SEG: 0 % — ABNORMAL LOW (ref 38–77)
Variant Lymph: 0 % (ref 0–0)

## 2006-11-10 LAB — CBC WITH DIFFERENTIAL/PLATELET
MCHC: 34.9 g/dL (ref 32.0–36.0)
Platelets: 18 10*3/uL — ABNORMAL LOW (ref 145–400)
RBC: 3.37 10*6/uL — ABNORMAL LOW (ref 3.70–5.32)
WBC: 65.4 10*3/uL (ref 3.9–10.0)

## 2006-11-10 LAB — TYPE & CROSSMATCH - CHCC

## 2006-11-13 LAB — CBC WITH DIFFERENTIAL/PLATELET
HCT: 28.9 % — ABNORMAL LOW (ref 34.8–46.6)
MCH: 30.6 pg (ref 26.0–34.0)
MCHC: 34.8 g/dL (ref 32.0–36.0)
MCV: 87.9 fL (ref 81.0–101.0)
Platelets: 8 10*3/uL — CL (ref 145–400)
RBC: 3.29 10*6/uL — ABNORMAL LOW (ref 3.70–5.32)

## 2006-11-13 LAB — MANUAL DIFFERENTIAL
ANC (CHCC manual diff): 0 10*3/uL — CL (ref 1.5–6.5)
Band Neutrophils: 0 % (ref 0–10)
Basophil: 0 % (ref 0–2)
Blasts: 97 % — ABNORMAL HIGH (ref 0–0)
EOS: 0 % (ref 0–7)
Myelocytes: 0 % (ref 0–0)
Other Cell: 0 % (ref 0–0)
PROMYELO: 0 % (ref 0–0)
SEG: 0 % — ABNORMAL LOW (ref 38–77)
nRBC: 0 % (ref 0–0)

## 2006-11-17 LAB — MANUAL DIFFERENTIAL
Band Neutrophils: 0 % (ref 0–10)
Basophil: 0 % (ref 0–2)
EOS: 0 % (ref 0–7)
MONO: 0 % (ref 0–14)
Other Cell: 0 % (ref 0–0)
PLT EST: DECREASED
SEG: 0 % — ABNORMAL LOW (ref 38–77)
nRBC: 1 % — ABNORMAL HIGH (ref 0–0)

## 2006-11-17 LAB — CBC WITH DIFFERENTIAL/PLATELET
HGB: 9.7 g/dL — ABNORMAL LOW (ref 11.6–15.9)
MCH: 30 pg (ref 26.0–34.0)
MCV: 85.3 fL (ref 81.0–101.0)
Platelets: 11 10*3/uL — ABNORMAL LOW (ref 145–400)
RDW: 13.3 % (ref 11.3–14.5)
WBC: 61.7 10*3/uL (ref 3.9–10.0)

## 2006-11-19 LAB — URINALYSIS, MICROSCOPIC - CHCC
Ketones: NEGATIVE mg/dL
Nitrite: NEGATIVE
Protein: 30 mg/dL
Specific Gravity, Urine: 1.02 (ref 1.003–1.035)
pH: 5 (ref 4.6–8.0)

## 2006-11-19 LAB — MANUAL DIFFERENTIAL
ALC: 3.5 10*3/uL — ABNORMAL HIGH (ref 0.9–3.3)
ANC (CHCC manual diff): 0 10*3/uL — CL (ref 1.5–6.5)
Blasts: 94 % — ABNORMAL HIGH (ref 0–0)
MONO: 0 % (ref 0–14)
Metamyelocytes: 0 % (ref 0–0)
Myelocytes: 0 % (ref 0–0)
PLT EST: DECREASED
PROMYELO: 0 % (ref 0–0)
SEG: 0 % — ABNORMAL LOW (ref 38–77)
Variant Lymph: 0 % (ref 0–0)

## 2006-11-19 LAB — HOLD TUBE, BLOOD BANK

## 2006-11-19 LAB — CBC WITH DIFFERENTIAL/PLATELET
HGB: 9.3 g/dL — ABNORMAL LOW (ref 11.6–15.9)
RBC: 3.05 10*6/uL — ABNORMAL LOW (ref 3.70–5.32)
RDW: 15.8 % — ABNORMAL HIGH (ref 11.3–14.5)
WBC: 58.1 10*3/uL (ref 3.9–10.0)

## 2006-11-24 ENCOUNTER — Ambulatory Visit: Payer: Self-pay | Admitting: Oncology

## 2006-11-24 LAB — MANUAL DIFFERENTIAL
ALC: 5.5 10*3/uL — ABNORMAL HIGH (ref 0.9–3.3)
RBC Comments: NORMAL

## 2006-11-24 LAB — CBC WITH DIFFERENTIAL/PLATELET
HGB: 8.2 g/dL — ABNORMAL LOW (ref 11.6–15.9)
MCV: 87.3 fL (ref 81.0–101.0)
Platelets: 11 10*3/uL — ABNORMAL LOW (ref 145–400)
RBC: 2.72 10*6/uL — ABNORMAL LOW (ref 3.70–5.32)
WBC: 68.2 10*3/uL (ref 3.9–10.0)

## 2006-11-25 LAB — TYPE & CROSSMATCH - CHCC

## 2006-11-27 LAB — MANUAL DIFFERENTIAL
ANC (CHCC manual diff): 0 10*3/uL — CL (ref 1.5–6.5)
Band Neutrophils: 0 % (ref 0–10)
Basophil: 0 % (ref 0–2)
Blasts: 96 % — ABNORMAL HIGH (ref 0–0)
LYMPH: 3 % — ABNORMAL LOW (ref 14–49)
Other Cell: 0 % (ref 0–0)
SEG: 0 % — ABNORMAL LOW (ref 38–77)
Variant Lymph: 0 % (ref 0–0)
nRBC: 0 % (ref 0–0)

## 2006-11-27 LAB — CBC WITH DIFFERENTIAL/PLATELET
MCHC: 34.5 g/dL (ref 32.0–36.0)
MCV: 87.2 fL (ref 81.0–101.0)
Platelets: 9 10*3/uL — CL (ref 145–400)
RBC: 3.48 10*6/uL — ABNORMAL LOW (ref 3.70–5.32)
RDW: 16.1 % — ABNORMAL HIGH (ref 11.3–14.5)

## 2006-12-01 LAB — CBC WITH DIFFERENTIAL/PLATELET
HGB: 9.2 g/dL — ABNORMAL LOW (ref 11.6–15.9)
MCH: 29.9 pg (ref 26.0–34.0)
MCHC: 34.1 g/dL (ref 32.0–36.0)
RBC: 3.08 10*6/uL — ABNORMAL LOW (ref 3.70–5.32)
RDW: 16.1 % — ABNORMAL HIGH (ref 11.3–14.5)

## 2006-12-01 LAB — HOLD TUBE, BLOOD BANK

## 2006-12-01 LAB — MANUAL DIFFERENTIAL
Blasts: 85 % — ABNORMAL HIGH (ref 0–0)
PLT EST: DECREASED
Polychromasia: NONE SEEN

## 2006-12-03 LAB — MANUAL DIFFERENTIAL
ALC: 3 10*3/uL (ref 0.9–3.3)
EOS: 0 % (ref 0–7)
LYMPH: 3 % — ABNORMAL LOW (ref 14–49)
MONO: 0 % (ref 0–14)
Metamyelocytes: 0 % (ref 0–0)
Myelocytes: 0 % (ref 0–0)
Other Cell: 0 % (ref 0–0)
PLT EST: DECREASED
Variant Lymph: 0 % (ref 0–0)

## 2006-12-03 LAB — CBC WITH DIFFERENTIAL/PLATELET
HGB: 9.3 g/dL — ABNORMAL LOW (ref 11.6–15.9)
RBC: 3.13 10*6/uL — ABNORMAL LOW (ref 3.70–5.32)
RDW: 15.8 % — ABNORMAL HIGH (ref 11.3–14.5)
WBC: 99.8 10*3/uL (ref 3.9–10.0)

## 2006-12-03 LAB — HOLD TUBE, BLOOD BANK

## 2006-12-08 LAB — MANUAL DIFFERENTIAL
ALC: 3.9 10*3/uL — ABNORMAL HIGH (ref 0.9–3.3)
ANC (CHCC manual diff): 1 10*3/uL — ABNORMAL LOW (ref 1.5–6.5)
Blasts: 93 % — ABNORMAL HIGH (ref 0–0)
MONO: 2 % (ref 0–14)
Metamyelocytes: 0 % (ref 0–0)
Myelocytes: 0 % (ref 0–0)
Other Cell: 0 % (ref 0–0)
SEG: 1 % — ABNORMAL LOW (ref 38–77)
Variant Lymph: 0 % (ref 0–0)

## 2006-12-08 LAB — CBC WITH DIFFERENTIAL/PLATELET
HCT: 24.8 % — ABNORMAL LOW (ref 34.8–46.6)
MCHC: 34.4 g/dL (ref 32.0–36.0)
Platelets: 8 10*3/uL — CL (ref 145–400)
RBC: 2.83 10*6/uL — ABNORMAL LOW (ref 3.70–5.32)
WBC: 96.6 10*3/uL (ref 3.9–10.0)

## 2006-12-11 LAB — MANUAL DIFFERENTIAL
Basophil: 0 % (ref 0–2)
EOS: 0 % (ref 0–7)
Myelocytes: 0 % (ref 0–0)
PROMYELO: 0 % (ref 0–0)

## 2006-12-11 LAB — CBC WITH DIFFERENTIAL/PLATELET
HCT: 28.1 % — ABNORMAL LOW (ref 34.8–46.6)
HGB: 9.3 g/dL — ABNORMAL LOW (ref 11.6–15.9)
MCH: 28.4 pg (ref 26.0–34.0)
MCV: 85.5 fL (ref 81.0–101.0)
Platelets: 15 10*3/uL — ABNORMAL LOW (ref 145–400)
RDW: 17.7 % — ABNORMAL HIGH (ref 11.3–14.5)
WBC: 88.6 10*3/uL (ref 3.9–10.0)

## 2006-12-11 LAB — HOLD TUBE, BLOOD BANK

## 2006-12-18 LAB — CBC WITH DIFFERENTIAL/PLATELET
HCT: 29.6 % — ABNORMAL LOW (ref 34.8–46.6)
HGB: 10.3 g/dL — ABNORMAL LOW (ref 11.6–15.9)
MCV: 83.8 fL (ref 81.0–101.0)
Platelets: 41 10*3/uL — ABNORMAL LOW (ref 145–400)
WBC: 68.3 10*3/uL (ref 3.9–10.0)

## 2006-12-18 LAB — MANUAL DIFFERENTIAL
Band Neutrophils: 0 % (ref 0–10)
EOS: 0 % (ref 0–7)
MONO: 2 % (ref 0–14)
Other Cell: 0 % (ref 0–0)
PROMYELO: 0 % (ref 0–0)
SEG: 0 % — ABNORMAL LOW (ref 38–77)
nRBC: 0 % (ref 0–0)

## 2006-12-22 LAB — MANUAL DIFFERENTIAL
ANC (CHCC manual diff): 1.5 10*3/uL (ref 1.5–6.5)
Band Neutrophils: 0 % (ref 0–10)
Basophil: 0 % (ref 0–2)
Blasts: 78 % — ABNORMAL HIGH (ref 0–0)
Metamyelocytes: 0 % (ref 0–0)
Myelocytes: 0 % (ref 0–0)
PROMYELO: 0 % (ref 0–0)
nRBC: 0 % (ref 0–0)

## 2006-12-22 LAB — CBC WITH DIFFERENTIAL/PLATELET
HGB: 9.6 g/dL — ABNORMAL LOW (ref 11.6–15.9)
MCH: 28.5 pg (ref 26.0–34.0)
RDW: 17.5 % — ABNORMAL HIGH (ref 11.3–14.5)

## 2006-12-24 LAB — CBC WITH DIFFERENTIAL/PLATELET
HCT: 28.5 % — ABNORMAL LOW (ref 34.8–46.6)
HGB: 9.6 g/dL — ABNORMAL LOW (ref 11.6–15.9)
Platelets: 38 10*3/uL — ABNORMAL LOW (ref 145–400)
WBC: 72.8 10*3/uL (ref 3.9–10.0)

## 2006-12-24 LAB — MANUAL DIFFERENTIAL
ALC: 7.3 10*3/uL — ABNORMAL HIGH (ref 0.9–3.3)
ANC (CHCC manual diff): 0.7 10*3/uL — ABNORMAL LOW (ref 1.5–6.5)
Band Neutrophils: 0 % (ref 0–10)
Blasts: 89 % — ABNORMAL HIGH (ref 0–0)
LYMPH: 10 % — ABNORMAL LOW (ref 14–49)
Metamyelocytes: 0 % (ref 0–0)
Other Cell: 0 % (ref 0–0)
SEG: 1 % — ABNORMAL LOW (ref 38–77)
Variant Lymph: 0 % (ref 0–0)

## 2006-12-25 LAB — MANUAL DIFFERENTIAL
Band Neutrophils: 0 % (ref 0–10)
Basophil: 0 % (ref 0–2)
EOS: 0 % (ref 0–7)
LYMPH: 5 % — ABNORMAL LOW (ref 14–49)
PLT EST: DECREASED
PROMYELO: 0 % (ref 0–0)
nRBC: 0 % (ref 0–0)

## 2006-12-25 LAB — CBC WITH DIFFERENTIAL/PLATELET
HGB: 9.3 g/dL — ABNORMAL LOW (ref 11.6–15.9)
MCH: 28.7 pg (ref 26.0–34.0)
MCV: 84.1 fL (ref 81.0–101.0)
RDW: 16.9 % — ABNORMAL HIGH (ref 11.3–14.5)

## 2006-12-29 LAB — CBC WITH DIFFERENTIAL/PLATELET
MCH: 28.5 pg (ref 26.0–34.0)
MCHC: 33.6 g/dL (ref 32.0–36.0)
RBC: 2.97 10*6/uL — ABNORMAL LOW (ref 3.70–5.32)
RDW: 17.3 % — ABNORMAL HIGH (ref 11.3–14.5)

## 2006-12-29 LAB — MANUAL DIFFERENTIAL
ANC (CHCC manual diff): 1 10*3/uL — ABNORMAL LOW (ref 1.5–6.5)
Basophil: 0 % (ref 0–2)
Blasts: 87 % — ABNORMAL HIGH (ref 0–0)
Metamyelocytes: 0 % (ref 0–0)
Myelocytes: 0 % (ref 0–0)
PROMYELO: 0 % (ref 0–0)

## 2007-01-01 LAB — MANUAL DIFFERENTIAL
ALC: 1.7 10*3/uL (ref 0.9–3.3)
MONO: 0 % (ref 0–14)
Myelocytes: 0 % (ref 0–0)
Other Cell: 0 % (ref 0–0)
PROMYELO: 0 % (ref 0–0)
SEG: 4 % — ABNORMAL LOW (ref 38–77)
Variant Lymph: 0 % (ref 0–0)

## 2007-01-01 LAB — TYPE & CROSSMATCH - CHCC

## 2007-01-01 LAB — CBC WITH DIFFERENTIAL/PLATELET
HGB: 10.6 g/dL — ABNORMAL LOW (ref 11.6–15.9)
Platelets: 14 10*3/uL — ABNORMAL LOW (ref 145–400)
RBC: 3.63 10*6/uL — ABNORMAL LOW (ref 3.70–5.32)
WBC: 86.7 10*3/uL (ref 3.9–10.0)

## 2007-01-02 ENCOUNTER — Emergency Department (HOSPITAL_COMMUNITY): Admission: EM | Admit: 2007-01-02 | Discharge: 2007-01-02 | Payer: Self-pay | Admitting: Emergency Medicine

## 2007-01-05 ENCOUNTER — Inpatient Hospital Stay (HOSPITAL_COMMUNITY): Admission: EM | Admit: 2007-01-05 | Discharge: 2007-01-30 | Payer: Self-pay | Admitting: Oncology

## 2007-01-05 ENCOUNTER — Ambulatory Visit: Payer: Self-pay | Admitting: Oncology

## 2007-01-06 ENCOUNTER — Encounter: Payer: Self-pay | Admitting: Oncology

## 2007-01-07 ENCOUNTER — Ambulatory Visit: Payer: Self-pay | Admitting: Oncology

## 2007-02-02 ENCOUNTER — Ambulatory Visit: Payer: Self-pay | Admitting: Oncology

## 2007-02-02 LAB — CBC WITH DIFFERENTIAL/PLATELET
Basophils Absolute: 0 10*3/uL (ref 0.0–0.1)
EOS%: 0.2 % (ref 0.0–7.0)
HCT: 25.6 % — ABNORMAL LOW (ref 34.8–46.6)
HGB: 8.9 g/dL — ABNORMAL LOW (ref 11.6–15.9)
LYMPH%: 30.8 % (ref 14.0–48.0)
MCH: 30.5 pg (ref 26.0–34.0)
MCV: 87.7 fL (ref 81.0–101.0)
MONO%: 58.5 % — ABNORMAL HIGH (ref 0.0–13.0)
NEUT%: 9.7 % — ABNORMAL LOW (ref 39.6–76.8)

## 2007-02-05 LAB — CBC WITH DIFFERENTIAL/PLATELET
Basophils Absolute: 0 10*3/uL (ref 0.0–0.1)
Eosinophils Absolute: 0 10*3/uL (ref 0.0–0.5)
HGB: 7.7 g/dL — ABNORMAL LOW (ref 11.6–15.9)
LYMPH%: 15.6 % (ref 14.0–48.0)
MONO#: 0.9 10*3/uL (ref 0.1–0.9)
NEUT#: 2.7 10*3/uL (ref 1.5–6.5)
Platelets: 9 10*3/uL — CL (ref 145–400)
RBC: 2.51 10*6/uL — ABNORMAL LOW (ref 3.70–5.32)
WBC: 4.2 10*3/uL (ref 3.9–10.0)

## 2007-02-05 LAB — TECHNOLOGIST REVIEW: Technologist Review: 1

## 2007-02-09 LAB — CBC WITH DIFFERENTIAL/PLATELET
Basophils Absolute: 0 10*3/uL (ref 0.0–0.1)
HCT: 20 % — ABNORMAL LOW (ref 34.8–46.6)
HGB: 7.1 g/dL — ABNORMAL LOW (ref 11.6–15.9)
MCH: 30.8 pg (ref 26.0–34.0)
MONO#: 1 10*3/uL — ABNORMAL HIGH (ref 0.1–0.9)
NEUT%: 28.5 % — ABNORMAL LOW (ref 39.6–76.8)
WBC: 3.9 10*3/uL (ref 3.9–10.0)
lymph#: 1.8 10*3/uL (ref 0.9–3.3)

## 2007-02-10 ENCOUNTER — Encounter (HOSPITAL_COMMUNITY): Admission: RE | Admit: 2007-02-10 | Discharge: 2007-02-26 | Payer: Self-pay | Admitting: Oncology

## 2007-02-16 LAB — MANUAL DIFFERENTIAL
Basophil: 0 % (ref 0–2)
EOS: 1 % (ref 0–7)
Metamyelocytes: 0 % (ref 0–0)
Myelocytes: 2 % — ABNORMAL HIGH (ref 0–0)
Other Cell: 0 % (ref 0–0)
PLT EST: DECREASED
PROMYELO: 0 % (ref 0–0)
SEG: 30 % — ABNORMAL LOW (ref 38–77)

## 2007-02-16 LAB — ERYTHROCYTE SEDIMENTATION RATE: Sed Rate: 58 mm/hr (ref 0–30)

## 2007-02-16 LAB — CBC WITH DIFFERENTIAL/PLATELET
HGB: 8.1 g/dL — ABNORMAL LOW (ref 11.6–15.9)
MCH: 29.7 pg (ref 26.0–34.0)
RBC: 2.74 10*6/uL — ABNORMAL LOW (ref 3.70–5.32)
RDW: 14.6 % — ABNORMAL HIGH (ref 11.3–14.5)

## 2007-02-23 LAB — MANUAL DIFFERENTIAL
ALC: 3.9 10*3/uL — ABNORMAL HIGH (ref 0.9–3.3)
ANC (CHCC manual diff): 5.5 10*3/uL (ref 1.5–6.5)
Band Neutrophils: 3 % (ref 0–10)
Blasts: 70 % — ABNORMAL HIGH (ref 0–0)
LYMPH: 12 % — ABNORMAL LOW (ref 14–49)
PROMYELO: 0 % (ref 0–0)
Variant Lymph: 0 % (ref 0–0)
nRBC: 0 % (ref 0–0)

## 2007-02-23 LAB — CBC WITH DIFFERENTIAL/PLATELET
HCT: 21.4 % — ABNORMAL LOW (ref 34.8–46.6)
HGB: 7.4 g/dL — ABNORMAL LOW (ref 11.6–15.9)
RDW: 14.7 % — ABNORMAL HIGH (ref 11.3–14.5)
WBC: 32.1 10*3/uL — ABNORMAL HIGH (ref 3.9–10.0)

## 2007-02-26 LAB — CBC WITH DIFFERENTIAL/PLATELET
HGB: 7.2 g/dL — ABNORMAL LOW (ref 11.6–15.9)
MCH: 29.3 pg (ref 26.0–34.0)
MCHC: 33.9 g/dL (ref 32.0–36.0)
RBC: 2.45 10*6/uL — ABNORMAL LOW (ref 3.70–5.32)
RDW: 14.6 % — ABNORMAL HIGH (ref 11.3–14.5)

## 2007-02-26 LAB — MANUAL DIFFERENTIAL
ANC (CHCC manual diff): 5.7 10*3/uL (ref 1.5–6.5)
Basophil: 0 % (ref 0–2)
Blasts: 85 % — ABNORMAL HIGH (ref 0–0)
EOS: 1 % (ref 0–7)
Metamyelocytes: 0 % (ref 0–0)
Myelocytes: 0 % (ref 0–0)
PROMYELO: 0 % (ref 0–0)

## 2007-02-27 LAB — TYPE & CROSSMATCH - CHCC

## 2007-03-02 LAB — MANUAL DIFFERENTIAL
ALC: 0 10*3/uL — ABNORMAL LOW (ref 0.9–3.3)
Band Neutrophils: 0 % (ref 0–10)
EOS: 0 % (ref 0–7)
MONO: 3 % (ref 0–14)
Myelocytes: 0 % (ref 0–0)
Other Cell: 0 % (ref 0–0)
PLT EST: DECREASED
PROMYELO: 0 % (ref 0–0)
SEG: 3 % — ABNORMAL LOW (ref 38–77)
Variant Lymph: 0 % (ref 0–0)
nRBC: 0 % (ref 0–0)

## 2007-03-02 LAB — CBC WITH DIFFERENTIAL/PLATELET
HGB: 9.2 g/dL — ABNORMAL LOW (ref 11.6–15.9)
MCV: 88.2 fL (ref 81.0–101.0)
Platelets: 20 10*3/uL — ABNORMAL LOW (ref 145–400)
WBC: 101.8 10*3/uL (ref 3.9–10.0)

## 2007-03-03 LAB — SEDIMENTATION RATE: Sed Rate: 64 mm/hr — ABNORMAL HIGH (ref 0–22)

## 2007-03-04 LAB — URINALYSIS, MICROSCOPIC - CHCC
Nitrite: NEGATIVE
Protein: 100 mg/dL
Specific Gravity, Urine: 1.02 (ref 1.003–1.035)

## 2007-03-05 LAB — URINE CULTURE

## 2007-03-09 LAB — CBC WITH DIFFERENTIAL/PLATELET
HGB: 7.8 g/dL — ABNORMAL LOW (ref 11.6–15.9)
RDW: 16.7 % — ABNORMAL HIGH (ref 11.3–14.5)
WBC: 192.9 10*3/uL (ref 3.9–10.0)

## 2007-03-09 LAB — MANUAL DIFFERENTIAL
ANC (CHCC manual diff): 3.9 10*3/uL (ref 1.5–6.5)
Band Neutrophils: 1 % (ref 0–10)
Basophil: 0 % (ref 0–2)
Blasts: 97 % — ABNORMAL HIGH (ref 0–0)
EOS: 0 % (ref 0–7)
LYMPH: 1 % — ABNORMAL LOW (ref 14–49)
Other Cell: 0 % (ref 0–0)
PLT EST: DECREASED
SEG: 1 % — ABNORMAL LOW (ref 38–77)
nRBC: 0 % (ref 0–0)

## 2007-03-09 LAB — ERYTHROCYTE SEDIMENTATION RATE: Sed Rate: >130 mm/hr (ref 0–30)

## 2007-03-11 LAB — CBC WITH DIFFERENTIAL/PLATELET
HCT: 20.9 % — ABNORMAL LOW (ref 34.8–46.6)
MCHC: 33.5 g/dL (ref 32.0–36.0)
RDW: 16.6 % — ABNORMAL HIGH (ref 11.3–14.5)

## 2007-03-11 LAB — MANUAL DIFFERENTIAL
ALC: 6.1 10*3/uL — ABNORMAL HIGH (ref 0.9–3.3)
ANC (CHCC manual diff): 2 10*3/uL (ref 1.5–6.5)
Basophil: 0 % (ref 0–2)
Blasts: 96 % — ABNORMAL HIGH (ref 0–0)
LYMPH: 3 % — ABNORMAL LOW (ref 14–49)
Metamyelocytes: 0 % (ref 0–0)
Myelocytes: 0 % (ref 0–0)
PLT EST: DECREASED
PROMYELO: 0 % (ref 0–0)
Variant Lymph: 0 % (ref 0–0)

## 2007-03-13 ENCOUNTER — Inpatient Hospital Stay (HOSPITAL_COMMUNITY): Admission: EM | Admit: 2007-03-13 | Discharge: 2007-03-14 | Payer: Self-pay | Admitting: Oncology

## 2007-03-13 ENCOUNTER — Ambulatory Visit: Payer: Self-pay | Admitting: Oncology

## 2007-03-23 DEATH — deceased

## 2008-04-15 IMAGING — CR DG CHEST 2V
2 series · 2 of 2 positions shown · non-contrast
Comparison: [REDACTED] chest x-ray 01/07/07.

CLINICAL DATA: Leukemia.   Followup.   Left lower lobe infiltrate.  Low grade fever.  Cough. 
 DIAGNOSTIC CHEST - 2 VIEW:

[w chest pa]
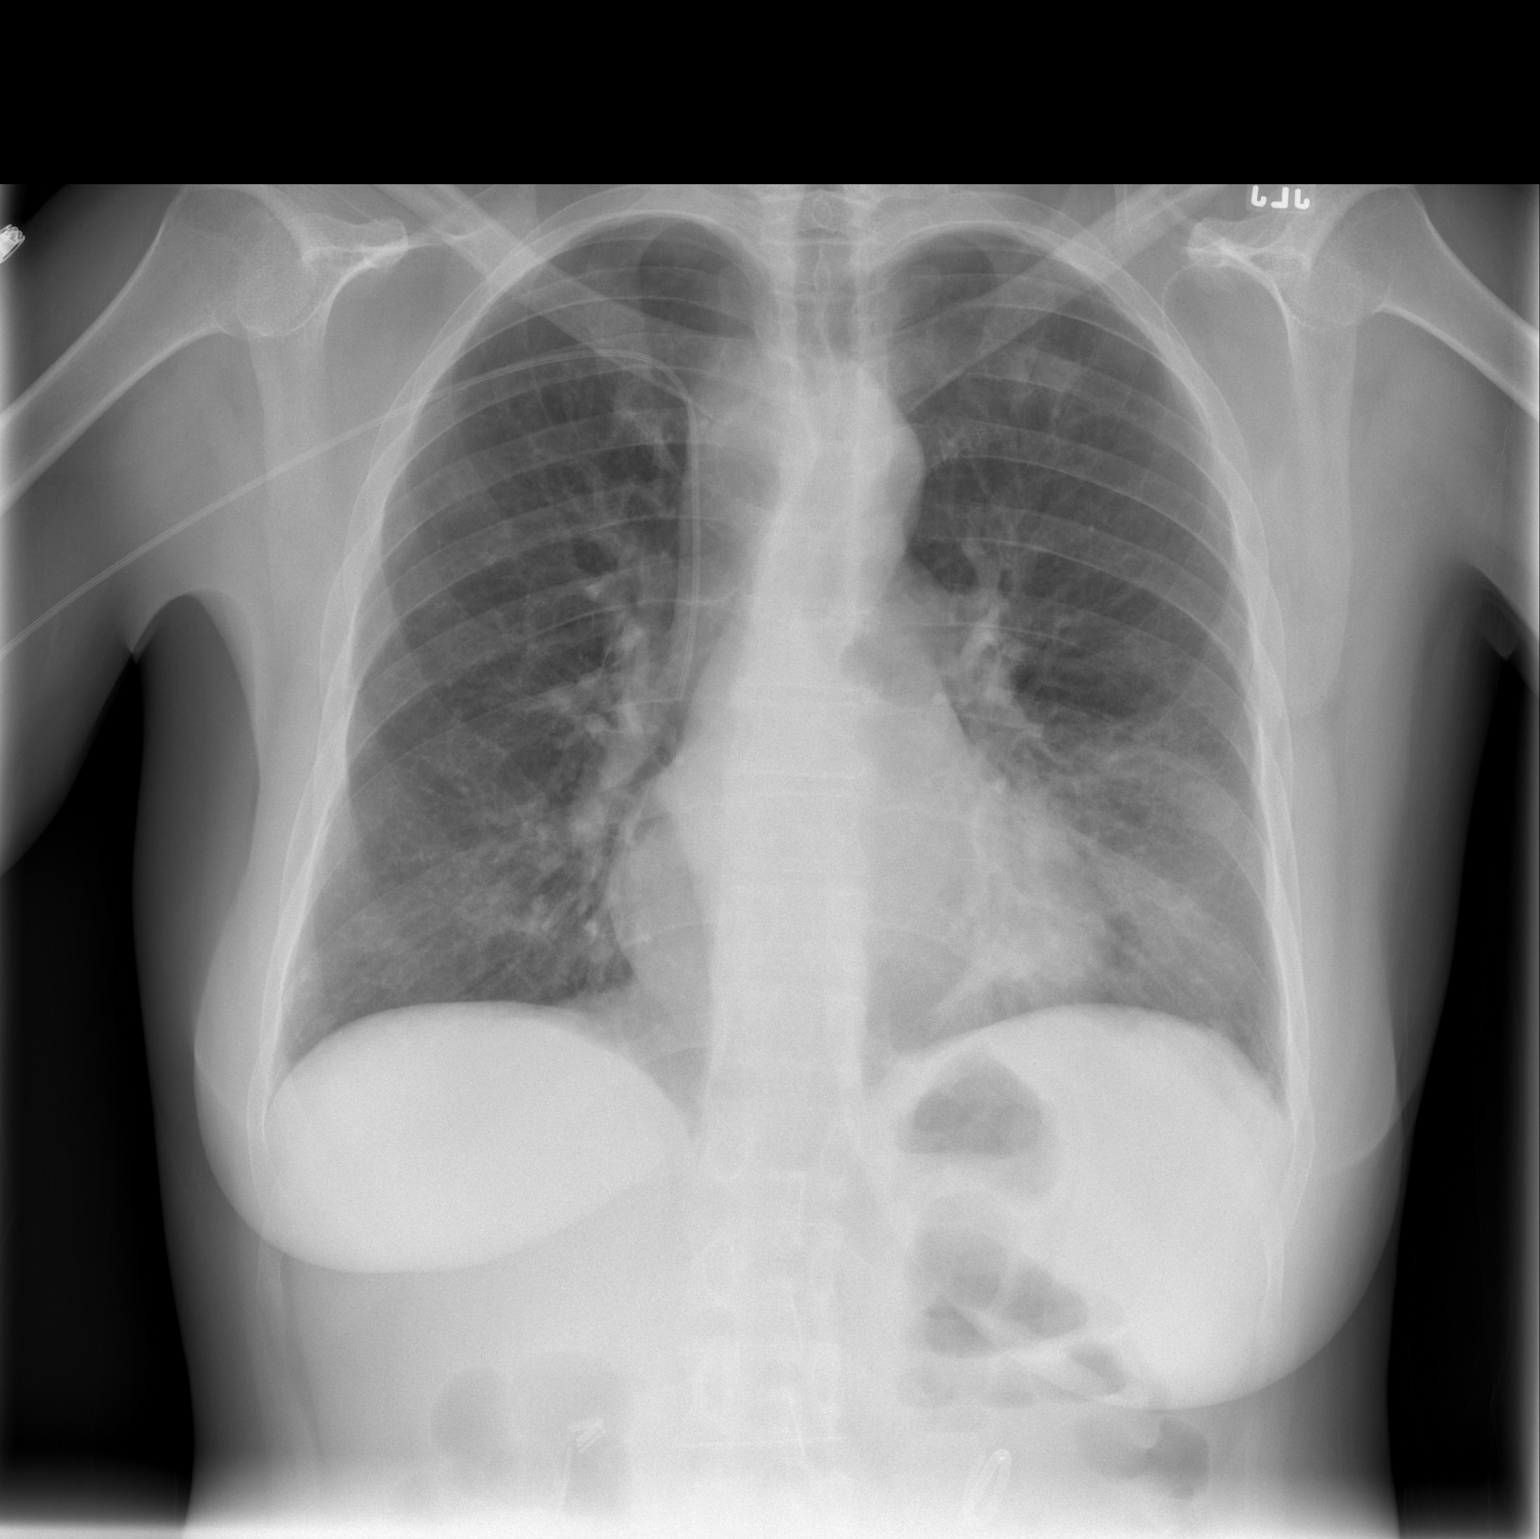

[w chest lat]
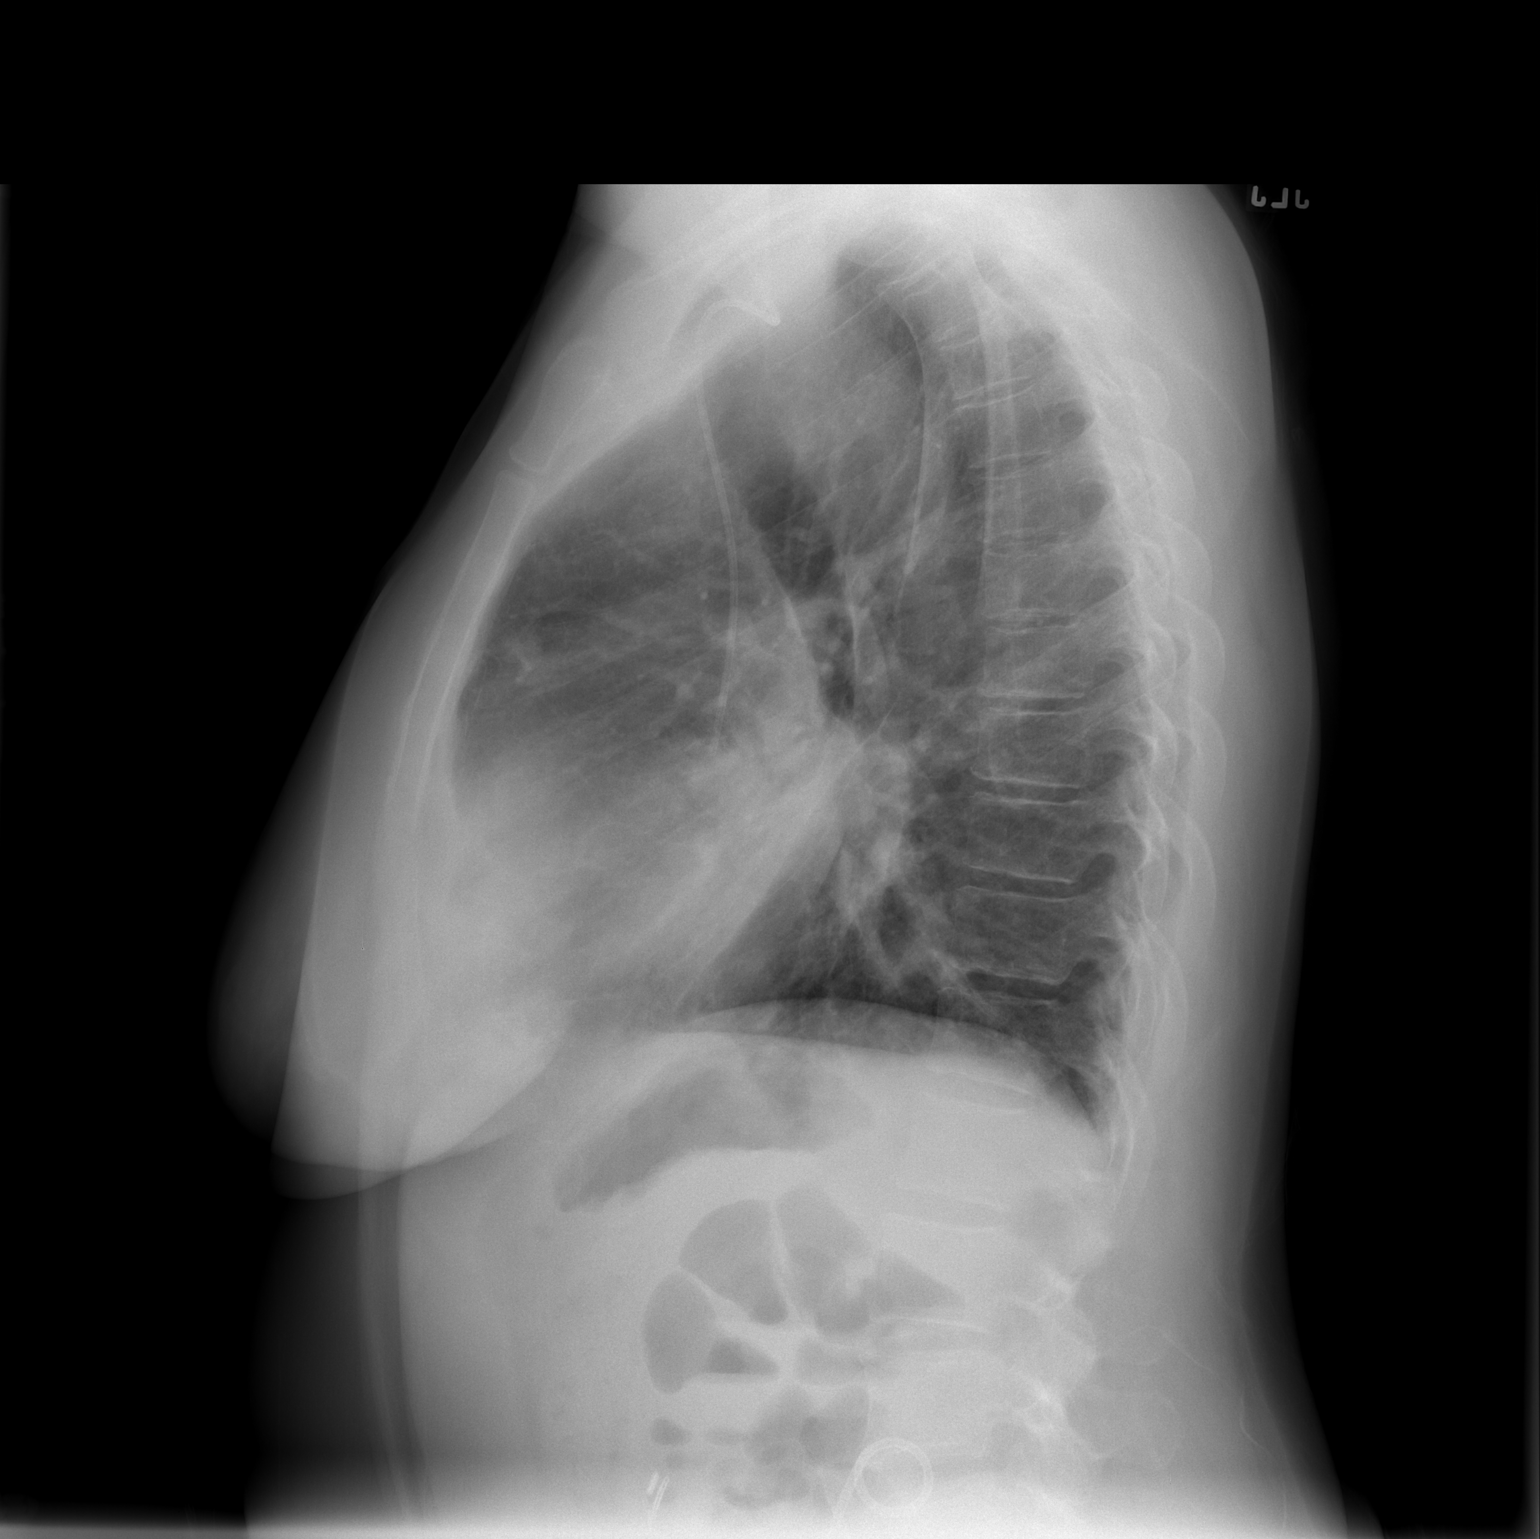

[2 of 2 positions shown; findings below may reference images not displayed]

FINDINGS: No change in patchy left lower lobe infiltrate and amorphous approximate 2 cm left apical opacity (nonspecific).  Stable diffuse prominence interstitium seen with lungs otherwise clear.  Heart size is normal.  No change in right PICC line ending at cavoatrial junction.  Again noted right-sided aortic arch.
IMPRESSION: Stable findings as described.

## 2008-04-23 IMAGING — CR DG CHEST 2V
2 series · 2 of 2 positions shown · non-contrast
Comparison: 01/11/07, 01/08/07 and 05/28/05.

CLINICAL DATA: 45-year-old female with history of breast and metastatic cervical carcinoma and leukemia.
 CHEST ? 2 VIEW:

[w chest pa]
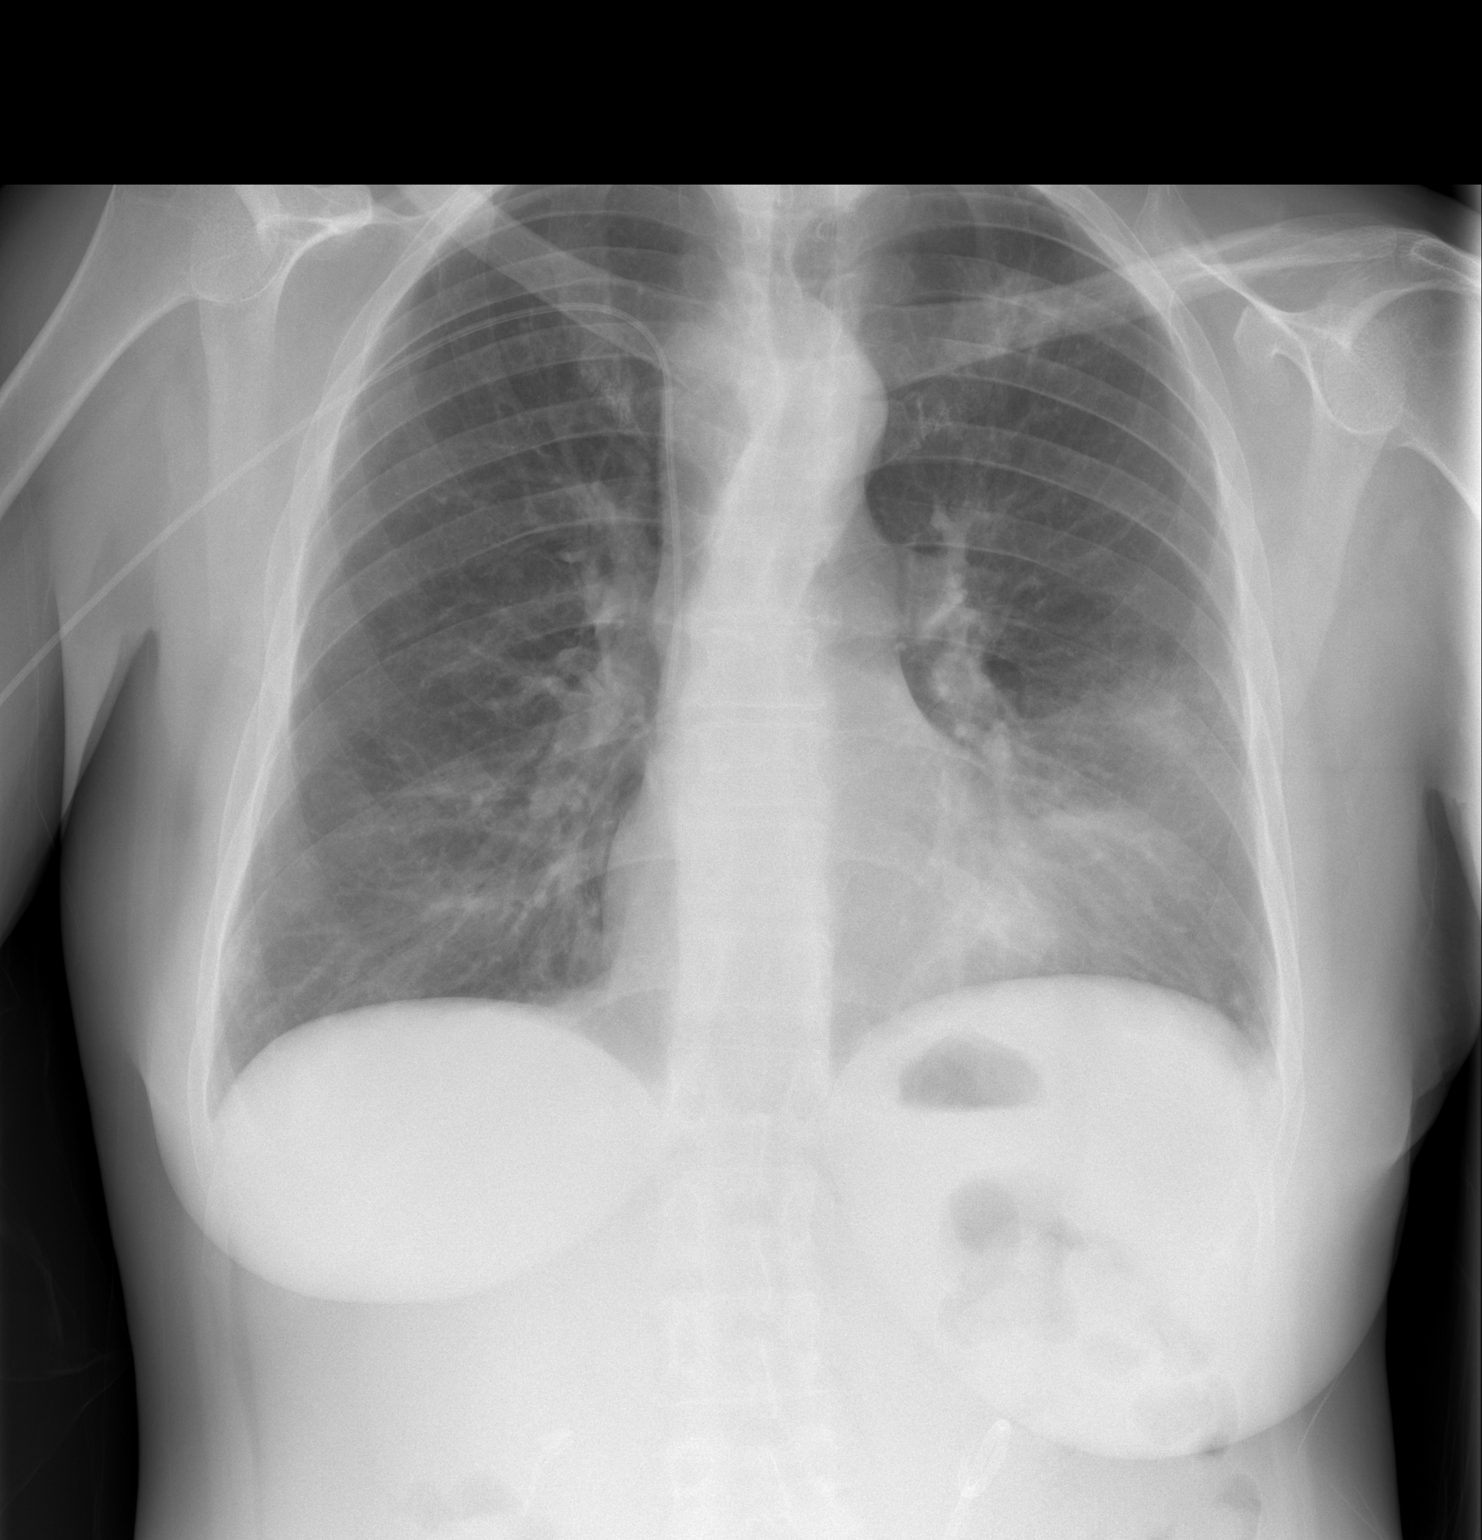

[w chest lat]
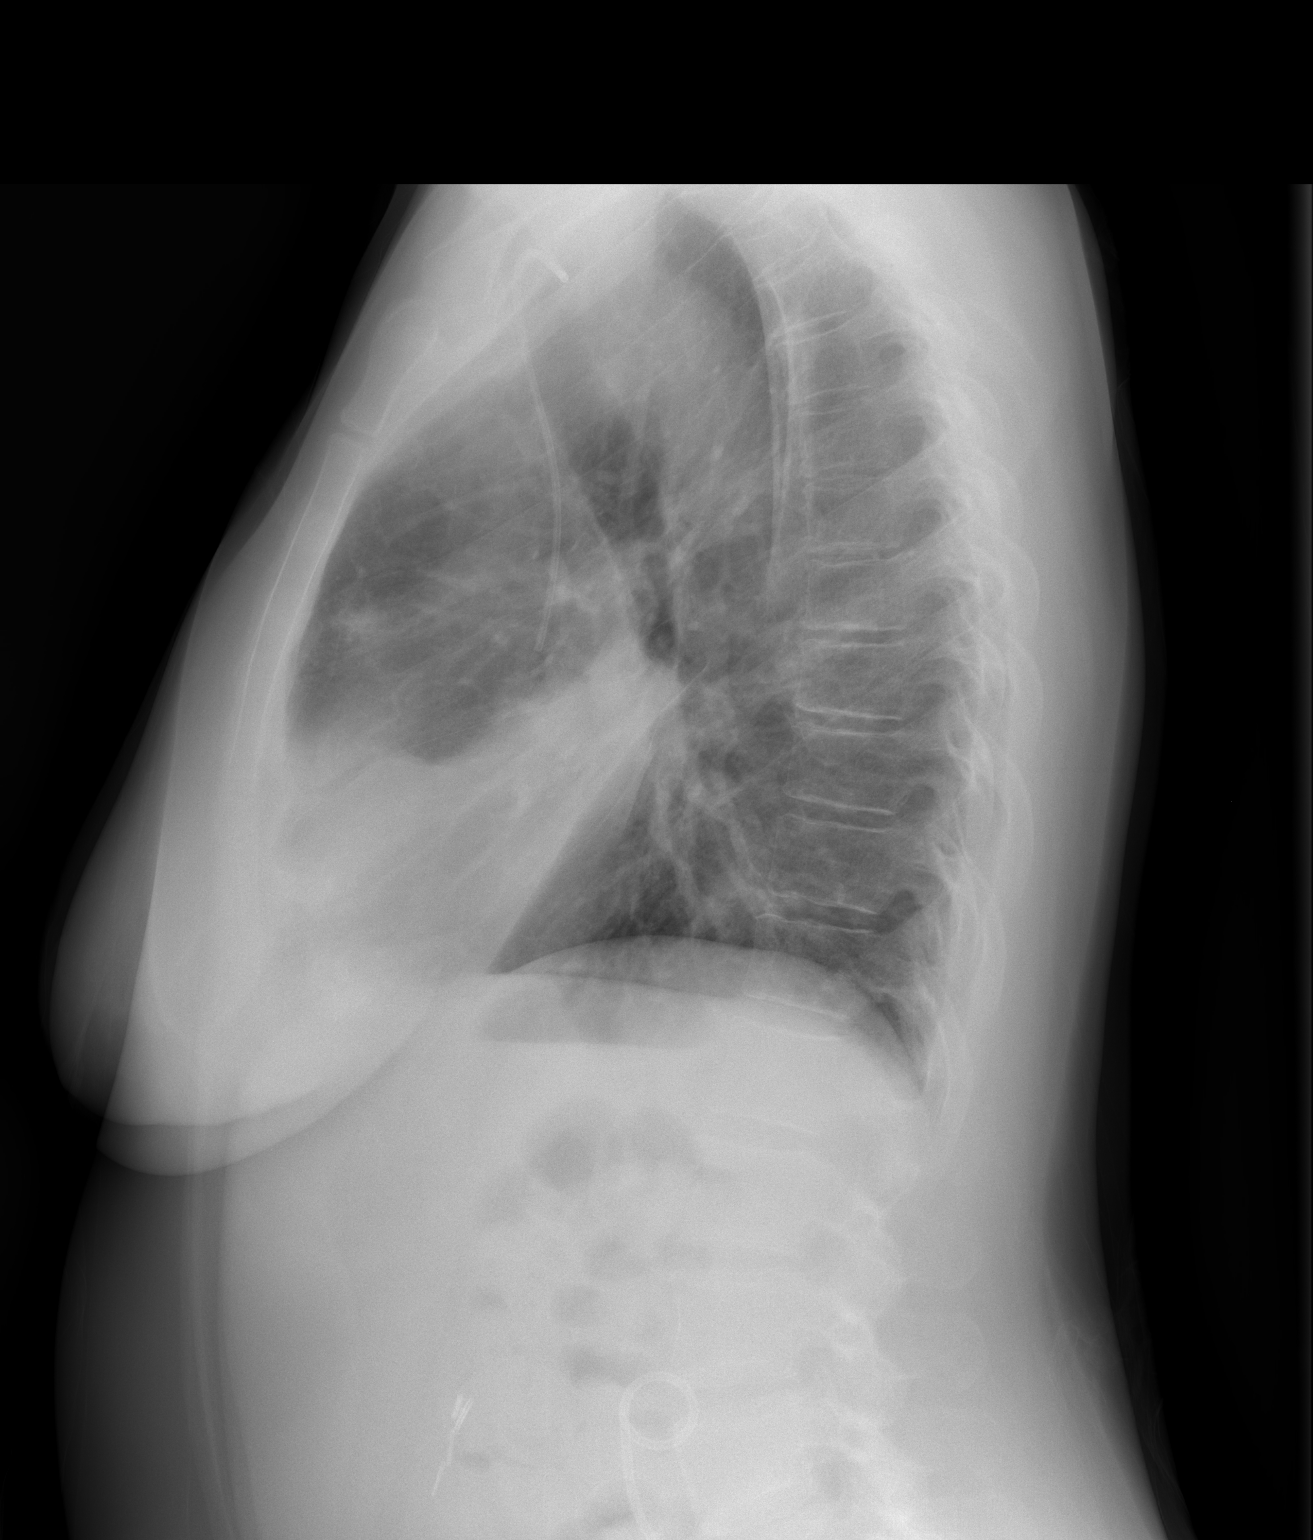

[2 of 2 positions shown; findings below may reference images not displayed]

FINDINGS: Right PICC line tip is in the SVC/RA junction.  Normal heart size.  Ill-defined nodular airspace disease persists in the left upper lobe superimposed over the clavicle and first rib.  This is stable. Increased pulmonary opacity is noted in the left lower lobe and lingula.  The left lower lobe changes are stable.  There appears to be slight progression of airspace disease and opacity in the lingula.  This could be related to pneumonia, leukemic involvement of the lungs or metastatic disease.  Right lung aeration remains stable.  No large effusion, CHF, edema or pneumothorax.  The patient is status post cholecystectomy.  Left ureteral stent is partially imaged in the abdomen.
IMPRESSION: 1. Persistent left upper lobe and left lower lobe nodular airspace disease.  
 2. Slight progression of lingula airspace opacity, as described.

## 2008-04-27 IMAGING — CR DG CHEST 1V PORT
1 series · 1 of 1 positions shown · non-contrast
Comparison: none

HISTORY: PICC line placement, leukemia

[view not recorded]
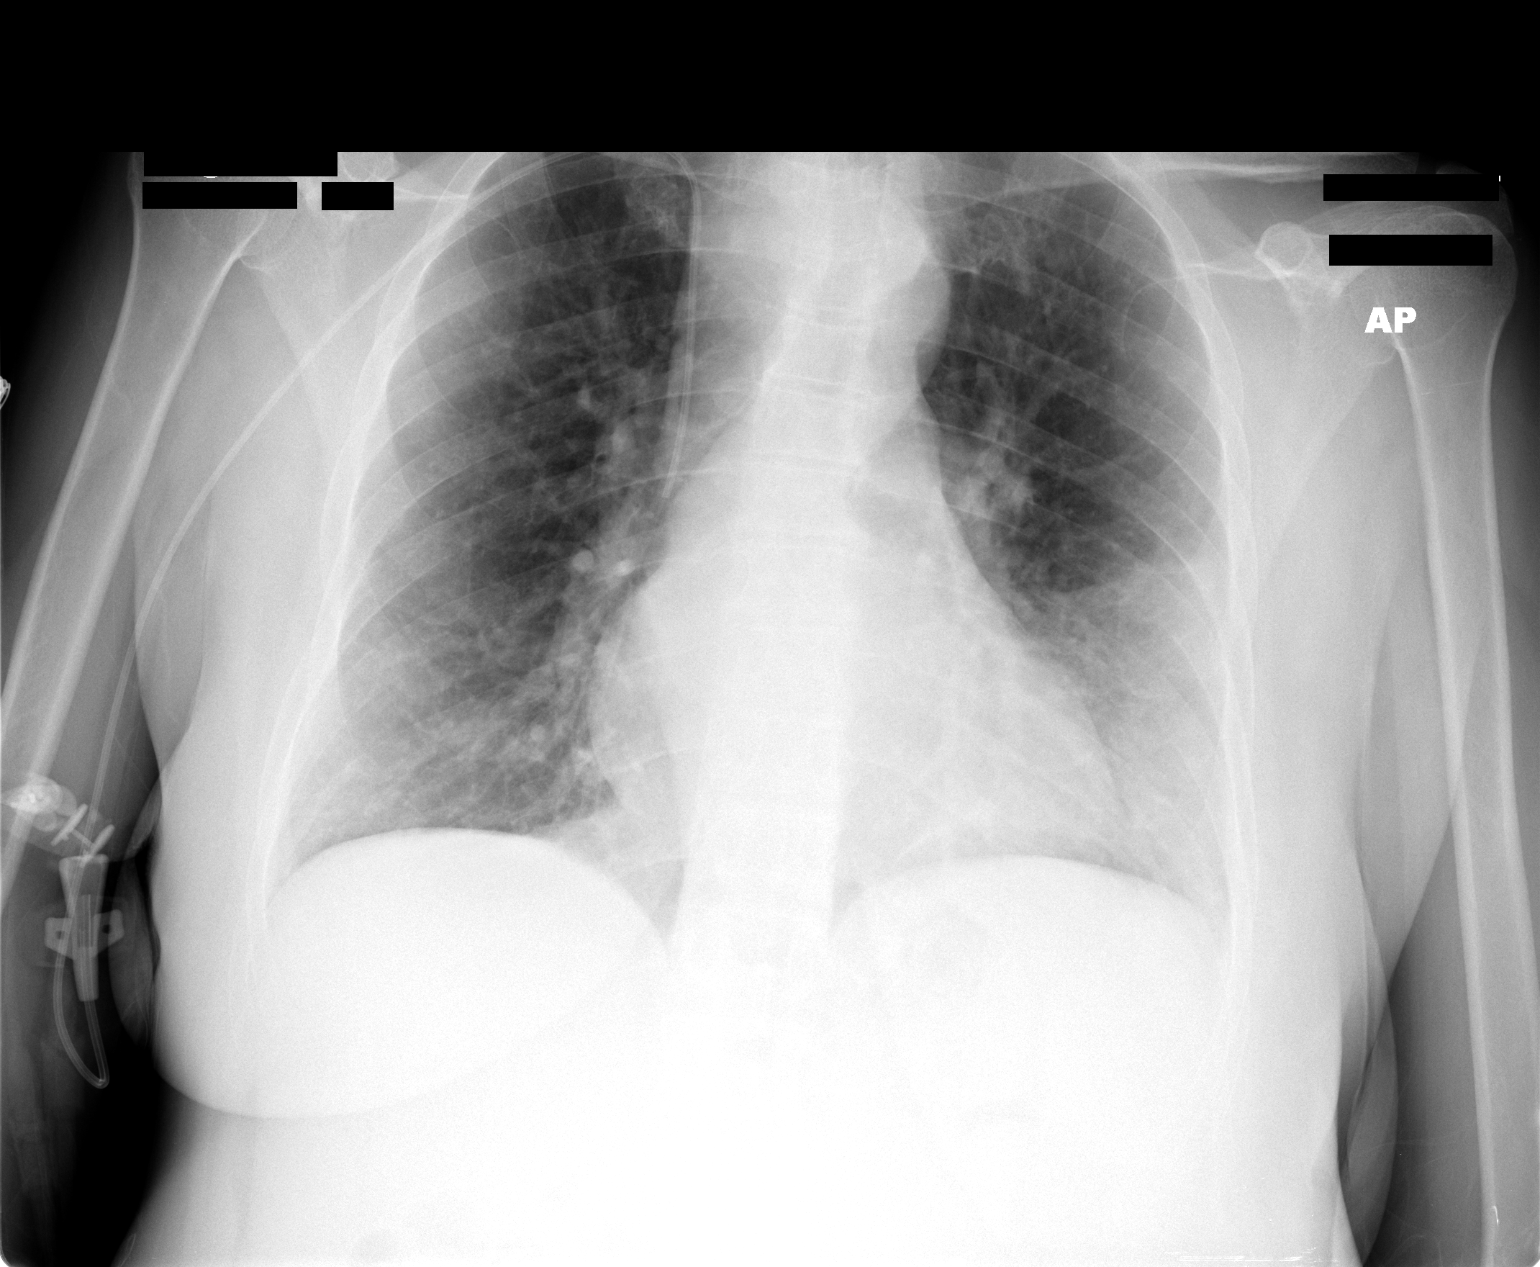

[1 of 1 positions shown; findings below may reference images not displayed]

PORTABLE CHEST ONE VIEW:

Portable exam 9955 hours compared to 01/16/2007

Right arm PICC line, tip SVC.
Minimal cardiac prominence.
Right aortic arch.
Minimal pulmonary vascular congestion.
Minimal bronchitic changes with right basilar atelectasis.
Persistent infiltrate in lower left chest within lingula, question pneumonia
versus leukemia.
Remaining lungs clear.
IMPRESSION: Tip of right arm PICC line in SVC.
Persistent lingular infiltrate.
Minimal right basilar atelectasis.

## 2010-08-11 ENCOUNTER — Encounter: Payer: Self-pay | Admitting: Oncology

## 2010-12-04 NOTE — H&P (Signed)
[name], [name] NO.:  [id number]   MEDICAL RECORD NO.:  [id number]          PATIENT TYPE:  INP   LOCATION:  1315                         FACILITY:  [facility]   PHYSICIAN:  Valentino Hue. 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  The  patient was already under hospice care at the time for her cervical  cancer (described below) and the possibility of induction treatment was  discussed with her.  At that time, the patient opted for supportive care  only.  She was treated with transfusions as needed, Hydrea, and Amicar.  It was not expected that the patient would be able to survive more than  a few weeks, but Kathleen [name], despite having significant problems with  pain and more recently fever, as well as, of course, very low counts,  has been able to not only survive but maintain enough of a functional  status and quality of life that she has reconsidered, and at this point  has agreed to induction treatment.  Accordingly, she is being admitted  for her first cycle of chemotherapy for her acute myeloid leukemia.   PAST MEDICAL HISTORY:  1. Also significant for squamous cell carcinoma of the cervix.  She is      status post hysterectomy in August of 2000.  She received radiation      and cis-platinum until October of 2000, then had recurrent disease      in September of 2005, treated with chemotherapy with very poor      tolerance, and currently the cervical carcinoma is the reason for      the hospice referral.  2. The patient also has a history of breast cancer initially biopsied      in October of 2000.  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  She had a      right lumpectomy with sentinel lymph node biopsy in November of      2004 for a  2.1-cm grade 3 tumor with no lymph node involvement.      She was treated with Cytoxan and Adriamycin followed by Taxol x8.      All chemotherapy completed May of 2005 followed by radiation and      completed in July of 2005.  3. Other problems include of course as noted pain.  4. Pancytopenia.  5. History of tobacco abuse.  6. History of cholecystectomy.  7. History of tonsillectomy and adenoidectomy.  8. History of panic attacks.  9. History of a heart murmur/  10.History of a right total hip replacement in February of 2006.   SOCIAL HISTORY:  The patient lives at home with her children, two of  whom are here today.  Her daughter, Kathleen [name], is her healthcare  power of attorney.  Kathleen [name] can be reached at [phone].  The patient is  divorced.  She is  a [employer].   FAMILY HISTORY:  The patient's father died in his 83s from a stroke.  Her mother has COPD but is alive.  She has 2 brothers and 3 sisters.  One brother died when he quit hemodialysis; one brother died with  tuberculosis; one sister died from a heart attack associated with  hemodialysis.   ALLERGIES:  She is intolerant of:  1. ASPIRIN.  2. NONSTEROIDALS.  3. CODEINE.   CURRENT MEDICATIONS:  1. Hydrea.  2. Methadone.  3. Percocet.  4. Xanax.  5. Neurontin.  6. Zofran.  7. Benicar.  8. Simethicone.  9. Caltrate.  10.Prilosec.  11.Prednisone.  12.Reglan.  13.Dulcolax.  14.Colace.  15.Senokot S.  16.Magic mouthwash.  17.Prozac.  18.Nitrofurantoin.  19.Levaquin.   REVIEW OF SYSTEMS:  For the last several days, she has had bad sweats  and fever, although she cannot tell me the exact temperature.  She has  felt weak and has been unable to walk because of weakness not because of  problems with balance.  She has had no significant headaches other than  mild sinus associated problems.  A little bit of a postnasal drip with a  cough, but nothing coming up from the lungs.  Certainly no hemoptysis  or purulence.   She has not been more short of breath than usual.  Denies  chest pain or pressure.  Has managed to have good bowel movements  despite the narcotics and has had some dysuria for which she was being  treated with nitrofurantoin prior to the admission.  Otherwise, separate  review of systems was noncontributory.   PHYSICAL EXAMINATION:  VITAL SIGNS:  On exam today, her temperature in  the office was 100.6, pulse 116 which is high for her (she usually runs  between 70 and 80), respiratory rate 20 and blood pressure 115/81.  Her  weight is 146 pounds.  HEENT:  Sclerae are not icteric.  Oropharynx - no thrush.  NECK:  No adenopathy.  LUNGS:  No crackles or wheezes with fair excursion bilaterally.  HEART:  Regular rate and rhythm.  ABDOMEN:  Soft, nontender.  Positive bowel sounds.  BREASTS:  Right breast status post lumpectomy.  No evidence of local  recurrence.  Left breast - no masses or discharge.  BACK:  No focal spinal tenderness.  EXTREMITIES:  No peripheral edema.  NEUROLOGIC:  Nonfocal.   LABORATORY DATA:  Pending.   IMPRESSION AND PLAN:  A 50 year old [place] woman with a new  diagnosis of acute myeloid leukemia established late March of 2008,  treated initially with supportive care alone, now admitted for  induction.   She will need an echocardiogram before we can start her daunorubicin.  We will start her ara-C infusion as soon as she has a PICC in place.  We  will obtain baseline cultures and continue prophylactic antibiotics with  Levaquin, Diflucan and acyclovir.  She will be started on allopurinol,  and assuming her echocardiogram is adequate, will have the standard  induction with 7:3 (ara-C/daunorubicin).   This patient has a living will in place and has healthcare power of  attorney, her daughter Kathleen [name], as noted above.  We are not writing a DNR  at this point since we are hoping the patient's acute leukemia can be  reversed.      Valentino Hue. Magrinat, M.D.   Electronically Signed     GCM/MEDQ  D:  01/05/2007  T:  01/05/2007  Job:  [id number]   cc:   Luisa Hart  Lurene Shadow, M.D.  [address]  [address]  [place]  [place] [address]   Eduardo Osier. Sharyn Lull, M.D.  Fax: [phone]   Jarome Matin, M.D.  Fax: [phone]   Billie Lade, M.D.  Fax: [phone]   Jackquline Denmark. Kyla Balzarine, M.D.  [address]  [place]  [place] [address]   Elinor Parkinson. Worthy Rancher, M.D.  Fax: [phone]   Pollyann Savoy, M.D.  Fax: [phone]

## 2010-12-04 NOTE — Op Note (Signed)
NAMEBURNIS, KASER NO.:  [id number]   MEDICAL RECORD NO.:  [id number]          PATIENT TYPE:  INP   LOCATION:  1315                         FACILITY:  [facility]   PHYSICIAN:  Lindaann Slough, M.D.  DATE OF BIRTH:  [birth year]   DATE OF PROCEDURE:  01/07/2007  DATE OF DISCHARGE:                               OPERATIVE REPORT   PREOPERATIVE DIAGNOSIS:  Left hydronephrosis secondary to  retroperitoneal adenopathy secondary to cervical cancer.   POSTOPERATIVE DIAGNOSIS:  Left hydronephrosis secondary to  retroperitoneal adenopathy secondary to cervical cancer.   PROCEDURE:  Cystoscopy, exchange of left double-J catheter.   SURGEON:  Danae Chen, M.D.   ANESTHESIA:  General.   INDICATION:  The patient is a 50 year old female who has a left double-J  catheter for hydronephrosis secondary to metastatic cervical cancer.  She is scheduled today for replacement of the double-J catheter.   DESCRIPTION OF PROCEDURE:  Under general anesthesia, the patient was  prepped and draped and placed in the dorsal lithotomy position.  A #22  panendoscope was inserted in the bladder.  There was bullous edema at  the left ureteral orifice.  There is a double-J catheter coming out of  the left ureteral orifice.  There is no stone or tumor in the bladder.  The double-J catheter was grasped with grasping forceps and pulled  through the urethra.  Then, a guide-wire was passed through the double-J  catheter and advanced all the way up into the renal pelvis and the  double-J catheter was removed.  The guide-wire was then back loaded into  the cystoscope and a 8 French 24 Polaris stent was passed over the guide-  wire.  The proximal curl of the stent is in the renal pelvis and the  loops of the stent are in the bladder.  The bladder was then emptied and  the cystoscope and guide-wire were removed.  The patient tolerated the  procedure well and left the OR in satisfactory condition to the  post  anesthesia care unit.      Lindaann Slough, M.D.  Electronically Signed     MN/MEDQ  D:  01/07/2007  T:  01/07/2007  Job:  [id number]   cc:   Valentino Hue. Magrinat, M.D.  Fax: [phone]

## 2010-12-04 NOTE — H&P (Signed)
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  She is being followed at [facility], she was seen  in the [facility] office on March 11, 2007, by  Kathleen Scale, PA-C, and Dr. Darnelle [name].  She had noted increase in fevers,  malaise, nausea, her WBC at that point showed a total white count of  202,000 with 96% blasts.  She did not wish for admit at that point, she  was started on Keflex 500 mg p.o. b.i.d., Allopurinol 300 mg per day,  and her Hydroxyurea was increased to 1 gm per day.  Over the past 48  hours, she has progressively declined, and at this point wishes to be  admitted to third floor Oncology for terminal care.  She has refused  admission to inpatient Hospice unit.  Dr. Darnelle [name], who is off today, is  aware of patient's condition, he stopped by the office this morning and  agreed to admission under Dr. Arline Asp.  Patient's prognosis is quite  poor, she is a DNR.   PAST MEDICAL HISTORY:  1. AML diagnosed March 28 of 2008, she had one cycle of chemo on January 05, 2007, at which point she actually did remarkably well but has      relapsed and has been in progressive decline.  2. Squamous cell carcinoma of the cervix S/P hysterectomy August of      2000 for which she received radiation and Cisplatinum until October      of 2000, then recurrent disease documented in September of 2005      treated with chemotherapy, poor tolerance.   Cervical carcinoma is      actually the primary reason for her Hospice referral.  3. History of breast carcinoma diagnosed in October of 2000, tumor was      triple-negative, she had a right lumpectomy with sentinel node      dissection in November of 2004 for a 2.1 cm grade III tumor without      lymph node involvement.  She received AC followed by Taxol x8,      chemo completed May of 2005 followed by XRT completed July of 2005.  4. Pancytopenia.  5. Pain control issues though on Hospice care with adequate pain      control currently.  6. History of tobacco abuse.  7. History of cholecystectomy.  8. History of tonsillectomy and adenoidectomy.  9. History of panic attacks.  10.History of a heart murmur.  11.History of a right total hip replacement in February of 2006.  12.Of  note, the patient also has systemic lupus.   FAMILY HISTORY:  The patient's father died at the age of 46 from a  stroke, her mother has COPD but is alive.  She has two brothers and  three sisters, one brother died when he quit hemodialysis, one brother  died with tuberculosis.  She has a sister, who died from heart attack  associated with hemodialysis.   SOCIAL HISTORY:  She lives at home with her children, her daughter,  Kathleen [name] is her healthcare power-of-attorney, again she is on  Hospice care, she is divorced, and she is also a Control and instrumentation engineer.   ALLERGIES:  INTOLERANT OF:  1. ASPIRIN.  2. NONSTEROIDALS IN GENERAL.  3. CODEINE.   CURRENT MEDICATIONS:  Hydroxyurea, Methadone, Percocet, Xanax,  Neurontin, Zofran, Benicar, Simethicone, Caltrate, Prilosec, prednisone,  Reglan, Dulcolax, Colace, Senokot-S-S, Magic Mouthwash, Prozac, Keflex.   REVIEW OF SYSTEMS:  As per HPI, really noncontributory to this  admission.   PHYSICAL EXAMINATION:  To be performed upon admit.   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  She will be admitted to  the floor for comfort care only, at this point I have written for her  Hydroxyurea, but patient may hold this per her request along with any of  her other medications she feels that she is not able to take at this  point.  Diet is as tolerated.  She is a Do Not Resuscitate.      Kathleen [name], M.D.  Electronically Signed     GCM/MEDQ  D:  03/13/2007  T:  03-17-07  Job:  [id number]

## 2010-12-04 NOTE — Discharge Summary (Signed)
Kathleen [name], Kathleen [name] NO.:  [id number]   MEDICAL RECORD NO.:  [id number]          PATIENT TYPE:  INP   LOCATION:  1315                         FACILITY:  [facility]   PHYSICIAN:  Valentino Hue. Magrinat, M.D.DATE OF BIRTH:  Dec 04, 1960   DATE OF ADMISSION:  01/05/2007  DATE OF DISCHARGE:  01/30/2007                               DISCHARGE SUMMARY   DISCHARGE DIAGNOSES:  1. Acute myelogenous leukemia.  2. Breast cancer.  3. Squamous cell carcinoma of the cervix, metastatic.  4. Status post hysterectomy, status post cisplatinum and radiation.  5. Status post chemotherapy with very poor tolerance, currently with      metastatic spread to lymph nodes in the mediastinum and possibly      left axilla and the liver, as well as the retroperitoneum causing      left hydronephrosis.  6. Hydronephrosis requiring stenting.  7. Pancytopenia, with anemia requiring transfusion.  8. Thrombocytopenia requiring transfusion.  9. Neutropenia and fever.  10.History of tobacco abuse.  11.Status post cholecystectomy.  12.Status post tonsillectomy and adenoidectomy.  13.History of panic attacks.  14.Status post right total hip replacement February 2006.   PROCEDURES:  Intravenous chemotherapy, transfusion of blood products, CT  scan of the chest, ureteral stent exchange, intravenous antibiotics,  echocardiogram.   HOSPITAL COURSE:  The patient was admitted on January 05, 2007, with a view  of induction therapy for her acute myelogenous leukemia, diagnosed  through bone marrow biopsy October 17, 2006.  However, before starting  intravenous treatment, it was felt to be prudent to obtain an  echocardiogram, since one of the two chemotherapy agents, namely the  daunorubicin, can be significantly cardiotoxic.  Coley an echocardiogram  January 06, 2007, and this showed a normal ejection fraction in the 65% to  75% range.  The patient had a fever at admission and was started on  intravenous antibiotics  which were modified, as her course progressed to  include antifungals and antivirals, as well as antibacterial  antibiotics.  She was severely thrombocytopenic on admission with a  platelet count of less than 10,000, which did not bump with  transfusion.  Other problems included chronic pain which was well  controlled, nausea secondary to her narcotics, and she was started on  Amicar for her severe thrombocytopenia.   The patient had a remote history of MRSA.  This was documented not to be  currently active, and so those precautions were relaxed.   By June 19, the patient's cultures were negative.  Her vitals were  stable.  Chest x-ray had showed a possible left lower lobe pneumonia,  but she was not having any other symptoms related to this, and this  indeed could be lymphangitic spread from her cervical cancer, so we  decided to start her chemotherapy on June 19, and she was started on the  continuous infusion Ara-C on that day.  She tolerated the Ara-C without  any significant complications, and on June 18 she had renal stents  changed by Dr. Brunilda Payor without event other than some hematuria.   On June 23, we started her daunorubicin  at 30 mg/m2 and she received a  total of 7 days of continuous infusion Ara-C at 100 mg/m2 and 3 days of  daunorubicin of 30 mg/m2, that dose chosen because of her prior  chemotherapy and very limited bone marrow reserve. Jenean tolerated the  daunorubicin also with minimal toxicity, no significant nausea, no  vomiting, and her very high white cell counts dropped to 1,000 by June  26 and remained at nadir until recovery by July 10, at which time it was  4.8.   She received transfusions for red cells, any time her hemoglobin dropped  below 8, and of platelets whenever the platelet count was less than  10,000, or for evidence of bleeding.  As her counts recovered in the  first week of July, her antibiotics were gradually discontinued without  her fever  recrudescing.  By July 11, her temperature was 98.3, pulse 69,  respiratory rate 20 and blood pressure 126/80.  Her room air saturation  was 100%.  Her lab work showed a white cell count of 4.5, hemoglobin 8.4  and platelets 26,000.  Her complete metabolic panel showed a sodium of  141, potassium 3.6, chloride 100, CO2 32, glucose 83, BUN 9, creatinine  0.83, normal liver function tests, and an albumin of 2.8 with a calcium  of 9.3.   On July 1, for further evaluation of the patient's fever and to follow  up the prior chest x-rays suggesting pneumonia, a CT of the chest was  obtained with contrast.  This showed a right arm PICC in place, normal  heart size, no effusions, no edema.  There was still air space disease  in the lingular segment of the left lung, which had not changed since  admission.  The thyroid was prominent.  This is not a change.  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  Left  axillary adenopathy was noted, the largest node measuring 1.6 cm, right  axillary nodes measured up to 1 cm (the patient had a right breast  cancer, initially biopsied in October 2000, triple negative, with  definitive surgery in November of 2004).   There was also mediastinal adenopathy.  More importantly, a large low-  density lesion measuring up to 4 cm was noted in the left hepatic lobe,  new from the patient's last CT scan, which had been obtained in November  2007.  Left hydronephrosis persisted, but the left kidney could not be  well-visualized.  By CT, the lung opacifications were more suspicious  for metastatic lesions, although again infection in the lingulae could  not be ruled out, but it had not changed at all one way or the other,  despite the antibiotics.   With this situation, it was felt the patient's counts and functional  status allowed her to be discharged, and on July 11 she was sent home by  cross cover.   CONDITION AT DISCHARGE:   Improved.   DISCHARGE MEDICATIONS:  1. Prozac 20 mg daily.  2. Neurontin 300 mg t.i.d.  3. Amicar 5 mg 2 tablets t.i.d.  4. Prilosec 20 mg daily.  5. Prednisone 5 mg daily.  6. Reglan 5 mg AC and h.s.  7. Xanax 2.5 mg t.i.d. as needed.  8. Percocet 1 tablet every 2 hours as needed.  9. Potassium 20 mEq daily as needed.   FOLLOW UP:  The patient had a followup already scheduled in my office  the week after discharge.  DIET:  Regular.   ACTIVITIES:  Were as tolerated.   The patient will continue to be followed by hospice after discharge.      Valentino Hue. Magrinat, M.D.  Electronically Signed     GCM/MEDQ  D:  02/13/2007  T:  02/14/2007  Job:  [id number]   cc:   Lindaann Slough, M.D.  Fax: [phone]   [facility], M.D.  [address]  [address]  [place]  [place] [address]   Eduardo Osier. Sharyn Lull, M.D.  Fax: [phone]   Jarome Matin, M.D.  Fax: [phone]   Billie Lade, M.D.  Fax: [phone]   Jackquline Denmark. Kyla Balzarine, M.D.  [address]  [place]  [place] [address]   Pollyann Savoy, M.D.  Fax: [phone]

## 2010-12-07 NOTE — Op Note (Signed)
NAME:  Kathleen [name], Kathleen [name]                          ACCOUNT NO.:  [id number]   MEDICAL RECORD NO.:  [id number]                   PATIENT TYPE:  INP   LOCATION:  5703                                 FACILITY:  [facility]   PHYSICIAN:  Leonie Man, M.D.                DATE OF BIRTH:  1960/07/24   DATE OF PROCEDURE:  02/10/2003  DATE OF DISCHARGE:                                 OPERATIVE REPORT   PREOPERATIVE DIAGNOSIS:  Chronic calculous cholecystitis.   POSTOPERATIVE DIAGNOSIS:  Chronic calculous cholecystitis.   OPERATION PERFORMED:  Laparoscopic cholecystectomy with intraoperative  cholangiogram.   SURGEON:  Leonie Man, M.D.   ASSISTANT:  Joanne Gavel, M.D.   ANESTHESIA:  General.   INDICATIONS FOR PROCEDURE:  The patient is a 50 year old woman with a  several month history of recurrent abdominal pain, primarily located in the  epigastrium and right upper quadrant associated with nausea and vomiting  following fatty food intake.  She had an abdominal ultrasound which shows  cholelithiasis.  Her CBC shows no evidence of acute inflammation.  Liver  function studies were normal.  Her most recent amylase level was mildly  elevated at 168.  The patient was admitted and brought to the operating room  now for laparoscopic cholecystectomy after the risks and potential benefits  of surgery had been thoroughly discussed, all questions answered and consent  obtained.   DESCRIPTION OF PROCEDURE:  Following the induction of satisfactory general  anesthesia with the patient positioned supinely, the abdomen was routinely  prepped and draped to be included in the sterile operative field.  Open  laparoscopy was created through a circumumbilical incision which was  deepened through the skin and subcutaneous tissue entering through the  midline.  Trocar was inserted and the peritoneal cavity inflated to 14 mmHg  pressure using carbon dioxide.  The laparoscope was introduced and visual  exploration of the abdomen carried out.  The liver edges were slightly  rounded.  Liver surface was smooth.  The gallbladder appeared not to be  acutely inflamed although there were multiple adhesions to the wall of the  gallbladder.  The anterior gastric wall and duodenal sweep appeared to be  normal and none of the small or large intestine viewed appeared to be a  normal.  Pelvic organs were not visualized.   Under direct vision, epigastric and lateral ports were placed and the  gallbladder was then grasped and retracted cephalad as multiple adhesions to  the gallbladder wall were taken down.  Dissection was carried down to the  region of the ampulla of the gallbladder where the cystic artery and cystic  duct were isolated, tracing the cystic artery up to its entry into the  gallbladder wall and the cystic duct up to the cystic duct gallbladder  junction and down to the cystic duct common duct junction. The cystic artery  was then doubly clipped and transected.  The cystic duct was clipped  proximally and opened.  I introduced a Reddick catheter into the abdomen  through a 14 gauge Angiocath and inserted into the cystic duct through which  I injected 50% Hypaque dye into the biliary system and did a fluoroscopic  cholangiogram.  The resulting cholangiogram showed free flow of contrast  into the duodenum through intrahepatic biliary ducts which were nondilated  and there were no filling defects noted.  Intrahepatic ducts were also noted  to be normal.  The cystic duct catheter was then removed and the cystic duct  was then triply clipped and transected.  The gallbladder was then dissected  free from the liver bed using electrocautery and maintaining hemostasis  throughout the course of dissection.  At the end of dissection, the liver  bed was again inspected and noted to be dry.  Sponge, instrument and sharp  counts were then verified.  The gallbladder retrieved through the umbilical   wound in an EndoCatch pouch.  This was accomplished without difficulty.  The  right upper quadrant was then thoroughly irrigated with normal saline and  aspirated.  A second sponge, instrument and sharp counts were then verified.  Wounds were then closed in layers as follows after the pneumoperitoneum was  allowed to deflate.  The umbilical wound in two layers with 0 Vicryl and a 4-  0 Monocryl, 4-0 Monocryl was used to close the epigastric and lateral ports.  All wounds were reinforced with Steri-Strips and sterile dressings were  applied.  Anesthetic was reversed and the patient removed from the operating  room to the recovery room in stable condition, having tolerated the  procedure well.                                                Leonie Man, M.D.    PB/MEDQ  D:  02/10/2003  T:  02/10/2003  Job:  [id number]   cc:   Jarome Matin, M.D.  [address] [place]  [place] [address]  Fax: [phone]

## 2010-12-07 NOTE — H&P (Signed)
Kathleen [name], [name] NO.:  [id number]   MEDICAL RECORD NO.:  [id number]          PATIENT TYPE:  IPS   LOCATION:  4025                         FACILITY:  [facility]   PHYSICIAN:  Kathleen [name], M.D.DATE OF BIRTH:  1960/09/25   DATE OF ADMISSION:  09/14/2004  DATE OF DISCHARGE:                                HISTORY & PHYSICAL   MEDICAL RECORD NUMBER:  Kathleen [name].   CHIEF COMPLAINT:  Right hip pain.   HISTORY OF PRESENT ILLNESS:  This Kathleen a 50 year old black female with a  history of cervical cancer, status post hysterectomy.  The patient had  received postoperative radiation and chemotherapy.  She also has a history  of lupus.  Subsequently, the patient developed a vascular necrosis of the  right hip and was admitted, on September 11, 2004, for a right total hip  replacement performed, by Dr. Madlyn [name]. Kathleen [name], the same day.  The patient  had a postoperative course significant for pancytopenia.  The patient  received blood transfusions.  She Kathleen on Coumadin for DVT prophylaxis.  She  Kathleen partial weightbearing on the right leg.  The patient received platelets  for significant thrombocytopenia.  Her last platelet count was 53 today.   REVIEW OF SYSTEMS:  The patient reports weight loss, weakness, joint  swelling.  Other review of systems items are in the written H&P.   PAST MEDICAL HISTORY:  1.  Lupus.  2.  Breast cancer which Kathleen in remission.  3.  Cervical cancer.  4.  Bronchitis.  5.  Depression.  6.  Heart murmur.  7.  Hysterectomy.  8.  Cholecystectomy.  9.  Right lumpectomy.  10. The patient has positive lymph nodes from breast cancer.   FAMILY HISTORY:  Noncontributory.   SOCIAL HISTORY:  The patient lives with three children.  She has a 20-year-  old child who will assist her after discharge.  The other three children are  in school but can help intermittently.   FUNCTIONAL HISTORY:  The patient was independent prior to arrival.  Currently the  patient Kathleen min assist with basic mobility, using a rolling  walker for approximately 50 feet.   MEDICATIONS PRIOR TO ARRIVAL:  Zoloft daily, Diazepam, Zofran, Compazine,  Nexium p.r.n., calcium, Norvasc.   ALLERGIES:  1.  ASPIRIN.  2.  CODEINE.  3.  IBUPROFEN.  4.  PYRIDIUM.   PHYSICAL EXAMINATION:  VITAL SIGNS:  The patient Kathleen stable and afebrile.  Vital signs are within normal limits.  HEENT:  She Kathleen normocephalic, atraumatic.  Pupils equal, round, reactive to  light and accommodation.  Oral mucosa Kathleen pink and moist.  NECK:  Supple without JVD or adenopathy.  CHEST:  Clear to auscultation bilaterally.  HEART:  Regular rate and rhythm.  EXTREMITIES:  No clubbing or cyanosis Kathleen noted but the patient has 1+ edema  of the right leg and hip area.  ABDOMEN:  Soft, nontender.  MUSCULOSKELETAL:  Motor exam reveals strength at 5/5 in the upper  extremities, 2+ to 3 out of 5 proximally in the right lower extremity and 4+  out of 5 distally.  The left leg Kathleen 4+ to 5 out of 5 throughout.  Sensory  exam Kathleen grossly intact.  Deep tendon reflexes are 2+.  NEUROLOGIC:  Cranial nerve exam Kathleen normal.  The patient cognitively Kathleen  within normal limits for judgment, orientation, and memory.  Affect Kathleen  appropriate but a bit subdued.  SKIN:  At the right hip Kathleen clean, dry, and intact with Steri-Strips.   ASSESSMENT/PLAN:  1.  Right total hip replacement secondary to avascular necrosis,      postoperative day four.  Begin comprehensive inpatient rehabilitation.      Goal Kathleen modified independent.  Estimated length of stay Kathleen approximately      7 days.  Prognosis Kathleen good.  2.  Pain management with Tylenol primarily and a muscle relaxant.  The      patient prefers not to use Vicodin.  3.  Deep vein thrombosis prophylaxis with Coumadin.  Need to observe      platelets closely.  4.  Lupus.  Continue prednisone.  5.  Pancytopenia.  Follow serial CBCs.      ZTS/MEDQ  D:  09/14/2004  T:  09/14/2004   Job:  [id number]

## 2010-12-07 NOTE — Discharge Summary (Signed)
NAMECAPRINA, WUSSOW NO.:  [id number]   MEDICAL RECORD NO.:  [id number]          PATIENT TYPE:  INP   LOCATION:  0453                         [facility]AC[facility]   PHYSICIAN:  Valentino Hue. Magrinat, M.D.DATE OF BIRTH:  [birth year]   DATE OF ADMISSION:  09/03/2004  DATE OF DISCHARGE:  09/13/2004                                 DISCHARGE SUMMARY   DISCHARGE DIAGNOSES:  1.  Avascular necrosis of the right femoral head.  2.  Adenosquamous carcinoma of the cervix, recurrent, currently controlled.  3.  Status post abdominal hysterectomy in 2000.  4.  Status post postoperative radiation with concurrent cisplatin with      progressive retroperitoneal adenopathy in September 2005.  Biopsy proven      to be metastatic recurrent cervical squamous cell carcinoma.  5.  Status post cisplatin/Carboplatin given weekly December through January      25, at which point computed tomography showed minimal progression in the      retroperitoneum with a clear pelvis.  The patient is now being followed      expectantly.  6.  History of breast cancer, status post right lumpectomy with lymph node      dissection in November 2004, for stage IIA ER/PR negative tumor treated      with adjuvant chemotherapy with no evidence of disease recurrence at      this time.  7.  Immune thrombocytopenia.  8.  Anemia requiring transfusion.  9.  History of lupus.  10. History of prior cholecystectomy, tonsillectomy and adenoidectomy.  11. Hypertension.  12. History of frequent bronchitis problems.  13. History of tobacco abuse.  14. History of a heart murmur.  15. History of panic attacks.  16. Allergies to ASPIRIN, possibly also to CODEINE.   PROCEDURES:  1.  Transfusion of blood products.  2.  Right total hip replacement on September 11, 2004.   HOSPITAL COURSE:  The patient had been complaining of some right-sided hip  pain.  A bone scan showed no obvious abnormality in that area.  Bone scan  showed  some uptake in that area, but plain films of the right hip were  negative.  MRI of the pelvis on February 11, showed avascular necrosis of  the right femoral head.  This is possibly due to her long-standing steroid  use for her lupus.  According, the patient was admitted on September 03, 2004, and consultation was obtained through Dr. Lajoyce Corners.  He felt the  patient was a good surgical candidate, but there were concerns regarding her  low counts.  At the time of admission, the patient's white cell count was  3.9, hemoglobin 9.6 and platelets 65,000.  The patient received multiple  platelet transfusions in an attempt to get her platelet count to be greater  than 100,000 and she also received packed red cells at the time of surgery.  On February 21, the patient's white cell count was 5.1.  Her hemoglobin was  11.1 and her platelet count was 96,000.  The patient then underwent the  procedure and has tolerated it remarkably  well.  Postoperatively, her  hemoglobin dropped to 9.6 and then 9.1, and she is being transfused again at  the time of this dictation.   Anticoagulation is routine after hip replacement and this patient should  receive Coumadin despite her thrombocytopenia so long as her platelet count  remains above 50,000.  At the time of this dictation, her white cell count  is 9, hemoglobin 9.1 and her platelet count 72,000.  Other labs include a  prothrombin time this morning of 1.3 with Coumadin being adjusted by  pharmacy and a creatinine of 0.9 with a BUN of 12.   CONDITION ON DISCHARGE:  Improved.   DISCHARGE MEDICATIONS:  1.  Protonix 40 mg daily.  2.  Risperdal 0.25 mg twice a day.  3.  Zoloft 100 mg at bedtime.  4.  Caltrate twice daily.  5.  Colace two tablets 100 mg twice a day.  6.  Trinsicon three times a day after meals.  7.  MiraLax 17 g daily in 8 ounces of fluid.  8.  Aranesp 100 mcg subcutaneously every Tuesday.  9.  Restoril 15 mg at bedtime as needed.  10.  Vicodin one tablet every 4 hours as needed for pain.  11. Tylenol one to two tablets every 4 hours as needed for pain.  12. Robaxin 500 mg every 6 hours as needed.  13. Reglan 10 mg before meals as needed for nausea.  14. Celebrex 100 mg twice daily.   ACTIVITY:  As per orthopedics and rehabilitation.   DIET:  Unrestricted.   WOUND CARE:  As per Dr. Nilsa Nutting service.   SPECIAL INSTRUCTIONS:  The patient should have her prothrombin time adjusted  by pharmacy.  She should have a CBC checked at least weekly while in the  hospital with transfusion as needed (generally for hemoglobin less than 9 or  platelets less than 50,000).   DISPOSITION:  Plan at this point is to transfer the patient to the  rehabilitation service when a bed becomes available.   FOLLOW UP:  She will follow up with me in the office as already scheduled.      GCM/MEDQ  D:  09/13/2004  T:  09/13/2004  Job:  [id number]   cc:   Oley Balm. [name], M.D.  [address]  [place]  [place] [address]  Fax: [phone]   Ellwood Dense, M.D.  [address] [place]  [place] [address]  Fax: [phone]

## 2010-12-07 NOTE — H&P (Signed)
Kathleen [name] NO.:  [id number]   MEDICAL RECORD NO.:  [id number]          PATIENT TYPE:  INP   LOCATION:  0456                         FACILITY:  [facility]   PHYSICIAN:  Valentino Hue. Magrinat, M.D.DATE OF BIRTH:  Jan 24, 1961   DATE OF ADMISSION:  09/03/2004  DATE OF DISCHARGE:                                HISTORY & PHYSICAL   HISTORY OF PRESENT ILLNESS:  Kathleen [name] is a 50 year old [place] woman  admitted for orthopedic consultation regarding avascular necrosis of the  right femoral head.   [name] has a history of cervical adenosquamous carcinoma.  She had an  abdominal hysterectomy in 2000 and received postoperative radiation with  concurrent cisplatin.  She had no evidence of disease until 9/05 when she  was found to have progressive retroperitoneal lymphadenopathy.  Needle  biopsy showed this was indeed metastatic squamous cell carcinoma most  consistent with her cervical primary and she was re-evaluated by Dr. Ronita Hipps who felt because of the prior radiation, that would not be an option  at this time and because surgery would not be helpful to her, he recommended  chemotherapy.  Kathleen [name] received cisplatin.  Kathleen [name] was treated with carboplatin  on a weekly basis from 10/05 through 08/15/04 (last dose).  At that point, CT  scans of the abdomen and pelvis showed that the retroperitoneal adenopathy  was minimally progressive.  The pelvis was clear.  It was decided we would  simply observe and repeat her scans again after 6-8 weeks.   More recently, the patient has been complaining of right-sided hip pain.  We  obtained a bone scan on 2/2 which showed a new focus of activity in the  right acetabular area but by plain films, there was no obvious tumor and  only minimal degenerative disease there.  Accordingly, an MRI of the pelvis  was obtained on 2/11 and this shows significant avascular necrosis of the  right femoral head.  Accordingly, we are admitting the  patient, keeping her  off weightbearing on the right leg, and obtaining orthopedic consultation.   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  The  tumor was also not amplified by FISH.  She was treated at that time with  adjuvant chemotherapy and there is no evidence of breast cancer recurrence  at this time.   In addition to the above, the patient has a prior cholecystectomy, prior  tonsillectomy, and adenoidectomy, history of hypertension, history of  frequent bronchitis, and history of tobacco abuse, history of panic attacks,  and history of a heart murmur.   ALLERGIES:  Aspirin, codeine, Pyridium.   MEDICATIONS:  Zoloft, diazepam, Risperdal, Zofran, Compazine, Nexium,  calcium with vitamin D, Norvasc, oxycodone, warfarin.   REVIEW OF SYSTEMS:  The patient's functional status is marginal.  She lives  at home with her children who are very helpful to her even though several of  them  are under age.  She does make it to her medical appointments and she  does her best to keep up with her medications.  She denies unusual headaches  at this point, visual changes, cough with production, pleurisy, shortness of  breath, or change in bowel or bladder habits.  She has no other site of bony  pain at this point.   PHYSICAL EXAMINATION:  VITAL SIGNS:  Temperature 97.1, pulse 103,  respirations 18, blood pressure 122/82, room air saturation 99%.  GENERAL:  There is no peripheral adenopathy.  LUNGS:  No crackles or wheezes.  BREASTS:  Exam was deferred.  HEART:  Regular rate and rhythm.  ABDOMEN:  Soft, nontender, positive bowel sounds.  MUSCULOSKELETAL:  No peripheral edema.  NEUROLOGIC:  Nonfocal.   LABORATORY DATA:  Pending.   IMPRESSION:  A 50 year old [place] woman with a history of:  1.  Cervical squamous  cell carcinoma, recurrent, but currently controlled.  2.  Breast cancer stage II, status post right lumpectomy and sentinel lymph      node biopsy in 2004 followed by Cytoxan, Adriamycin, and Taxol with no      evidence of recurrence.  3.  Avascular necrosis of the right femoral head, admitted for orthopedic      evaluation.   PLAN:  The plan as far as Kathleen [name]'s cancers are concerned right now is for  observation and re-assessment perhaps in another month.  Dr. Kyla Balzarine  recommended as possible treatment gemcitabine, topotecan, or combination  therapy in addition to a platinum agent which she has just completed.  The  issue right now is whether we need to surgically repair or replace the right  femoral head or whether we are going to have to deal with this new  development on a palliative basis.      GCM/MEDQ  D:  09/03/2004  T:  09/03/2004  Job:  [id number]

## 2010-12-07 NOTE — H&P (Signed)
[facility]  Patient:    Kathleen [name], Kathleen [name] Visit Number: [id number] MRN: [id number]          Service Type: GON Location: GYN Attending Physician:  Ronita Hipps T Adm. Date:  03/11/2001                           History and Physical  NO DICTATION. Attending Physician:  Ronita Hipps T DD:  03/11/01 TD:  03/12/01 Job: [id number]

## 2010-12-07 NOTE — Op Note (Signed)
Kathleen [name], Kathleen [name] NO.:  [id number]   MEDICAL RECORD NO.:  [id number]          PATIENT TYPE:  INP   LOCATION:  0453                         FACILITY:  [facility]   PHYSICIAN:  Madlyn Frankel. [name], M.D.  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   PROCEDURE:  Right total hip replacement.   COMPONENTS USED:  DePuy hip system with a size 50 Pinnacle cup, 28 neutral  metal liner, and S/ROM 18 x 13, 36 standard neck length with a 28+0 ball.   SURGEON:  Madlyn Frankel. [name], M.D.   ASSISTANTDruscilla Brownie. Cherlynn June.   ANESTHESIA:  General.   BLOOD LOSS:  400 mL.   DRAINS:  One.   COMPLICATIONS:  None apparent.   INDICATIONS FOR PROCEDURE:  Ms. Mccrackin is a 50 year old black female with an  extensive medical history, including lupus requiring steroid use for  multiple years and currently on prednisone 7.5 mg a day.  She also has a  history of breast cancer and most recently cervical cancer.  She was  admitted through the hematology/oncology service by Dr. Darnelle Catalan for  persistent right hip discomfort that was unrelieved from pain medication.  She was unable to care for herself, so she was admitted for pain control.   MRI had been ordered of her right hip, which revealed a significant femoral  head avascular necrotic lesion with total head involvement.  A radiograph  had indicated a crescent sign with some collapse.   After reviewing these findings and discussing with the patient the risks,  benefits and treatment options available, I felt that the best choice here  was a right total hip replacement with a plan for backup of  hemiarthroplasty.   After reviewing these risks and benefits, consent was obtained.   PROCEDURE IN  DETAIL:  Patient was brought to the operating theater.  Once  adequate preoperative antibiotics were administered, Ancef 1 gm, the patient  was positioned in the left lateral decubitus position with the right side  up.  The right lower extremity was prepped and draped in a sterile fashion.  A slightly curvilinear, lateral-based incision was made for a posterior  approach to the hip.  Short external rotators were taken down separate from  the posterior capsule, which was unable to be repaired following reaming.  Following exposure, neck osteotomy was made.  At this point, this was based  off of anatomical landmarks and preoperative templates.  The femoral head  was identified and noted to have some fragmentation of cartilage and was  loose at the top, consistent with a collapse.  Examination of the acetabulum  revealed there was a fragment of cartilage within the joint, which could  have been coming from the femoral side.  Nonetheless, the decision was to  proceed with total hip replacement.  Following this, her femoral head was  very small, and the acetabulum was very small.  I began reaming with a 45  reamer and carried up to a 49 reamer.  I did not feel it was safe, based on  the anatomy, to proceed any higher, but at this point, I had very punctate  bleeding.  This was a very important factor because she had her pelvis  irradiated in 2000.  Given the fact that she had punctate bleeding, it  indicated that she had a live pelvic bone and was not any concern for any  failure of prosthesis to have bone in-growth.  The final 50 mm cup was then  impacted anatomically, just beneath the anterior rim with good coverage  posteriorly.  A trial liner was placed.  At this point, attention was  directed to the femur initially. I had planned on doing a summit-type  prosthesis, but based on the patient's anatomy, with a very neutral neck and  a very small neck from anterior to posterior, we decided that  we needed to  use S/ROM prosthesis.  For this reason, the femur was prepared for S/ROM  protocol with axial reaming to a 13.5 three-quarters of the way down,  followed by proximal reaming to an 18B and milling to a B small.  A trial  reduction was carried out, initially with a 36+8 off-set stem; however, I  was unable to get this reduced secondary to increased off-set.  For this  reason, we went back to a 36 standard off-set.  With the 28 +0 ball, the hip  was reduced.  The hip was extremely stable.  The leg lengths appeared equal,  compared to the left leg down.  She had about 1 mm of shift.  She was able  to be internally rotated to at least 80 degrees with the hip flexed in  neutral abduction and stable in external rotation.  Given these parameters,  I was very comfortable with this.  Trial components were removed.  A single  cancellous bone screw was used to stabilize the acetabulum.  A man-hole  cover was placed followed by placement of a 28 neutral metal liner.  The  final 18B small sleeve was then impacted to the level of the trial, followed  by placement of a stem.  The hip was then reduced with a 28+0 ball, and it  was stable with a trial reduction.  Given these factors, the hip was  copiously irrigated with normal saline solution.  A medium Hemovac drain was  placed.  A posterior capsule was unable to be reapproximated, and thus was  excised.  Short external rotators were unable to be reapproximated either  and were not free.  At this point, the iliotibial band was reapproximated  using a #1 Ethibond, the gluteus maximus fascia with a #1 running Vicryl.  The remainder of the wound was closed in layers with two layers of  subcuticular fat closure, then a running 4-0 Monocryl on the skin.  The hip  was clean, dry, and dressed sterilely with Steri-Strips, dressings, sponges,  and tape.  The patient was then extubated and dropped across to the recovery room in stable  condition.      MDO/MEDQ  D:  09/11/2004  T:  09/11/2004  Job:  [id number]

## 2010-12-07 NOTE — Consult Note (Signed)
[facility]  Patient:    Kathleen [name], Kathleen [name] Visit Number: [id number] MRN: [id number]          Service Type: OUT Location: GYN Attending Physician:  Jeannette Corpus Dictated by:   Rande Brunt Clarke-Pearson, M.D. Proc. Date: 09/01/01 Admit Date:  09/01/2001   CC:         [name], M.D.  Telford Nab, R.N.   Consultation Report  A 50 year old African-American female returns for continuing follow-up of cervical cancer.  She had a cut through hysterectomy ultimately treated with chemo/radiation completed in October 2000.  She has been followed since that time with no evidence of recurrent disease, although two of her last Pap smears showed either atypical squamous cells or mild dysplasia.  The patient denies any vaginal bleeding or discharge.  She has no pelvic pain.  She does complain of pain across her lower abdomen.  She is currently under the work-up of her primary care physician for this problem.  She denies any nausea and vomiting or any vaginal bleeding.  PAST MEDICAL HISTORY:  Essentially negative.  PAST SURGICAL HISTORY:  Prior hysterectomy.  CURRENT MEDICATIONS:  Premarin 1.25 mg daily.  ALLERGIES:  ASPIRIN, CODEINE, PYRIDIUM.  SOCIAL HISTORY:  Otherwise unchanged from initial notations.  PHYSICAL EXAMINATION  VITAL SIGNS:  Weight 176 pounds, blood pressure 130/80.  GENERAL:  The patient is a healthy, slightly obese African-American female in no acute distress.  HEENT:  Negative.  NECK:  Supple without thyromegaly.  NODES:  No supraclavicular or inguinal adenopathy.  ABDOMEN:  Soft.  She does have some tenderness throughout the lower abdomen just beneath the umbilicus.  There are no discreet masses or nodularity and no evidence of ascites.  PELVIC:  EGBUS:  Normal.  Vagina is clean except for the apex which has some radiation changes to it and is slightly friable.  Bimanual and rectovaginal examinations  reveal no masses, induration, or nodularity.  IMPRESSION:  Past history of carcinoma of the cervix status post simple hysterectomy followed by radiation therapy and chemotherapy.  Recent mildly dysplastic Pap smear.  Pap smears are repeated again today.  At this juncture I would like to alternate visits with Dr. Dierdre Forth and have the patient see her in six months and return to see [place] six months later.  I would not perform colposcopy or do any biopsies unless she had high grade changes on her Pap smear.  She will return to see me in one year for continuing follow-up. Dictated by:   Rande Brunt. Clarke-Pearson, M.D. Attending Physician:  Jeannette Corpus DD:  09/01/01 TD:  09/01/01 Job: [id number]

## 2010-12-07 NOTE — H&P (Signed)
Kathleen [name], Kathleen [name] NO.:  [id number]   MEDICAL RECORD NO.:  [id number]          PATIENT TYPE:  INP   LOCATION:  1330                         FACILITY:  [facility]   PHYSICIAN:  Valentino Hue. 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 She had contacted our office on September 09, 2006 stating that her left leg had become painful and slightly  edematous. She also had some cramping of both feet and soreness in the  inner thighs. Hospice had assessed the patient, and it was felt that we  should proceed with a Doppler. Doppler was performed on the left lower  extremity on September 09, 2006, Doppler was negative for DVT. The  patient contacted our office this morning saying that she was on her way  in due to the pain within her left leg and with actuality pain on both  lower extremities. She was seen last by Dr. Ruthann Cancer for a  routine medical oncology visit on July 28, 2006, at that time he  started her on Vicodin 5/500. She has been utilizing about 5 in a 24-  hour period, her last dose taken at 8:30 this morning. She is also on  Neurontin 300 mg in the a.m., 600 mg in the p.m., Tramadol 50 mg q.6 h.  She has been really unable to utilize OxyContin and Percocet due to the  fact that she had significant mental status changes while on it. On  retrospect, Kathleen [name] states that she has had the left-sided pain for quite  some  time, but really the pain is in her lower back and radiates around  to the front. She denies any bowel or urinary incontinence. She is able  to move her legs but again just any real pressure causes pain though  pressure on her lower back actually eases the pain a bit. No fevers or  chills, no shortness of breath, chest pain, no nausea, emesis, diarrhea  or constipation issues. She denies any bleeding or bruising issues but  of note she received a 2 unit packed RBC on  August 20, 2006, she  really did not experience a huge bump in her energy level. After  review, it was noted that her hemoglobin was 7.7 grams with a hematocrit  of 22.6%, platelet count of 90,000 which is chronic in nature with WBCs  of 4800 with an AMC of 3000. CMET was pending. Physical exam showed  stable vital signs, but suspicion of a possible vertebral body fracture,  therefore after consultation with Dr. Ruthann Cancer it was felt  prudent for  hospitalization for IV pain control, and MRI of the LS spine  to be assessed with possible vertebroplasty if indicated.   PAST MEDICAL HISTORY:  1. History of a right-sided breast carcinoma diagnosed November 2004,      status post right lumpectomy followed by adjuvant AC, followed by      Taxol and radiation.  2. History of cervical carcinoma diagnosed in August 2002 status post      TAH followed by radiation with cisplatin completed October 2002.      She has had a recurrence of her cervical carcinoma involving the      left periaortic lymph node in September 2005. She also has a      history of left-sided nephrosis status post stent placement by Dr.      Brunilda Payor.  3. Systemic lupus erythematosus.  4. MDS.   PAST SURGICAL HISTORY:  As per past medical history to include:  Status  post right hip replacement for treatment of a vascular necrosis and  cholecystectomy.   ALLERGIES:  Multiple. CODEINE, MACROBID, ASPIRIN, PYRIDIUM, PERCOCET,  OXYCONTIN, RISPERDAL, HYOSCYAMINE,  HYDROXYCHLOROQUINE, MECLIZINE,  NORVASC, ZYRTEC, DIFLUCAN, METRONIDAZOLE and DIAZEPAM.   MEDICATIONS ON ADMISSION:  1. Vicodin 5/500 one to two p.o. q.6-8 h p.r.n. pain.  2. Neurontin 300 mg in the a.m., 600 mg in the p.m.  3. Tramadol 50 mg 1 p.o. q.h.s.  4. Prednisone 20 mg p.o. q.a.m.  5. Prilosec 20 mg p.o. daily.  6. Nitrofurantoin 100 mg 1 p.o. daily.  7. Reglan 10 mg p.o. a.c. and q.h.s.  8. Zofran p.r.n. nausea.   FAMILY HISTORY:  Noncontributory.   SOCIAL HISTORY:  The patient resides here in [place] with 2 children  though 3 of her sons are present today. She is being cared for by the  hospice program here in [place]. Hospice nurse visits are frequent.  She continues to smoke, denies any alcohol risks.   REVIEW OF SYSTEMS:  As per HPI otherwise noncontributory.   PHYSICAL EXAMINATION:  VITAL SIGNS:  Blood pressure 104/71, pulse 88,  respirations 20, temperature 98.1, weight was not obtained.  HEENT:  Conjunctiva pink, sclera anicteric. Oropharynx is benign without  mucositis or candidosis.  LUNGS:  Clear to auscultation. No vertebral tenderness in the  cervicothoracic spine region though she does have some point tenderness  in the lower lumbar spine region.  HEART:  Regular rate and rhythm.  ABDOMEN:  Normal bowel sounds.  EXTREMITIES:  Essentially benign. No evidence of pedal edema.   LABORATORY DATA:  As per HPI.   IMPRESSION/DISPOSITION:  1. Acute pain in lower back in patient with a history of metastatic      cervical carcinoma and history of breast carcinoma on hospice care.      The patient will be admitted to the oncology floor. Of note she has      had mental status changes with various pain medications in the      past, therefore we will utilize low-dose morphine sulfate at 2 mg      on an every 4 hour basis as needed for pain. She will continue on      Neurontin 300 mg but will increase the frequency to t.i.d. She will     continue on Ultram 50 mg  every 6 hours. We will add Celebrex 100 mg      orally twice a day We will obtain a MRI of the lumbosacral spine,      in the event that  she has evidence of a compression fracture she      may be a vertebroplasty candidate.  2. Anemia now chronic in nature felt to represent an myelodysplastic      syndrome, the patient will be typed and screened at the time of      intravenous site start tentatively to receive 2 units of packed red      blood cells within the next 24-36 hours to be ordered by Dr.      Darnelle Catalan. 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  3. History of breast carcinoma.  4. History of lupus on prednisone.   The patient is a do not resuscitate, this has been documented in our  chart, and the hospice. This will be continued through this  hospitalization.      Amada Kingfisher, P.A.      Valentino Hue. Magrinat, M.D.  Electronically Signed    CTS/MEDQ  D:  09/10/2006  T:  09/10/2006  Job:  [id number]

## 2010-12-07 NOTE — Op Note (Signed)
NAMEMarland Kitchen  DORTHIA, TOUT NO.:  [id number]   MEDICAL RECORD NO.:  [id number]          PATIENT TYPE:  AMB   LOCATION:  DAY                          FACILITY:  [facility]   PHYSICIAN:  Lindaann Slough, M.D.  DATE OF BIRTH:  1961-06-08   DATE OF PROCEDURE:  02/24/2006  DATE OF DISCHARGE:                                 OPERATIVE REPORT   PREOPERATIVE DIAGNOSIS:  Left hydronephrosis secondary to retroperitoneal  adenopathy secondary to cervical cancer.   POSTOPERATIVE DIAGNOSIS:  Left hydronephrosis secondary to retroperitoneal  adenopathy secondary to cervical cancer.   PROCEDURE DONE:  1. Cystoscopy.  2. Left retrograde pyelogram.  3. Insertion of double-J catheter.   SURGEON:  Dr. Brunilda Payor   ANESTHESIA:  General.   DATE OF PROCEDURE:  February 24, 2006.   INDICATION:  The patient is a 50 year old female, who was diagnosed in 2000  to have cervical cancer.  She was treated with hysterectomy followed by  radiation and chemotherapy.  A CT scan of the abdomen and pelvis done  recently showed left hydronephrosis and retroperitoneal adenopathy.  She is  scheduled today for cystoscopy and insertion of left double-J catheter.   Under general anesthesia, the patient was prepped and draped and placed in  the dorsal lithotomy position.  A #22 Wappler cystoscope was inserted in the  bladder.  The bladder mucosa is normal.  There is no stone or tumor in the  bladder.  The ureteral orifices are in normal position and shape.   RETROGRADE PYELOGRAM:  A cone-tip catheter was passed through the cystoscope  and into the left ureteral orifice.  Contrast was then injected through the  cone-tip catheter.  The distal and mid ureter appear normal.  There is  moderate dilation of the upper ureter with a kink in the upper ureter at the  level L2.  The renal pelvis and collecting system are moderately dilated.   The cone-tip catheter was then removed.  A Glidewire was passed through the  cystoscope and into the left ureter.  Then a #6-French 26 double-J catheter  was passed over the Glidewire.  The proximal curl of the double-J catheter  is  in the renal pelvis.  The distal curl is in the bladder.  The bladder was  then emptied, and the cystoscope and the Glidewire were removed.   The patient tolerated the procedure well and left the OR in satisfactory  condition to postanesthesia care unit.      Lindaann Slough, M.D.  Electronically Signed     MN/MEDQ  D:  02/24/2006  T:  02/24/2006  Job:  [id number]   cc:   Valentino Hue. Magrinat, M.D.  Fax: [phone]   Leonie Man, M.D.  [address]  [address]  [place]  [place] [address]

## 2010-12-07 NOTE — Consult Note (Signed)
NAMEBEONKA, AMESQUITA NO.:  [id number]   MEDICAL RECORD NO.:  [id number]          PATIENT TYPE:  OUT   LOCATION:  GYN                          FACILITY:  [facility]   PHYSICIAN:  John T. Kyla Balzarine, M.D.    DATE OF BIRTH:  1960-08-26   DATE OF CONSULTATION:  02/19/2005  DATE OF DISCHARGE:                                   CONSULTATION   CHIEF COMPLAINT:  Ongoing followup of recurrent cervical carcinoma with  chronic thrombocytopenia.   HISTORY OF PRESENT ILLNESS:  This patient had dual primaries including  cervical and breast cancers. Cervical cancer was diagnosed in 2000 and was  treated with surgery followed by radiation with concurrent cisplatin  chemotherapy. In 2001, she was diagnosed with breast cancer which has  remained in remission. In October 2005, she was diagnosed with lupus. In  September 2005, she had fatigue and back pain with progressive  retroperitoneal lymphadenopathy at the lumbosacral junction. An almost 3 cm  node was positive for squamous cell carcinoma by fine needle aspirate  biopsy. She was treated with single agent taxol but had a progressive  neuropathy, switched to weekly carboplatin at an AUC of 2. 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 At that  time, she was found thrombocytopenia which has persisted since, with  platelets varying between 30 and 50,000. At one point, she was scheduled to  receive topotecan but this was not administered because of her continued  thrombocytopenia. The patient received multiple antibiotics around the time  of her hip surgery and has recently been started on Flagyl because of  confirmed clostridia difficile. The patient has a myriad of other complaints  including skin lesions, myalgias and arthralgias. She has not been evaluated  by her rheumatologist for quite some time. Pap smear at [facility] revealed low grade SIL and she was called back for colposcopy.   Past history, personal social history, family history and review of systems  are essentially unchanged, as recorded in my clinic note from October 2005.   CURRENT MEDICATIONS:  Prednisone, Compazine, Robaxin, Risperdal, valium,  Zoloft, Percocet p.r.n., Flagyl, Norvasc, calcium supplementation and  Femtabs. She continues Coumadin now 1 mg daily.   Otherwise comprehensive 10 point review of systems is negative.   PHYSICAL EXAMINATION:  VITAL SIGNS:  Weight 222 pounds, blood pressure  122/88.  GENERAL:  The patient is alert and oriented x3 in no acute distress. LYMPH  SURVEY:  Negative.  BACK:  There is no back or CVA tenderness.  ABDOMEN:  Scaphoid, soft and benign without ascites, mass or organomegaly.  No hernia is present.  EXTREMITIES:  Limited abduction of the hips with well healed surgical scar  at the site of hip replacement. There is no peripheral edema.  PELVIC:  External genitalia and BUS are normal to inspection and palpation.  Bladder and urethra are well supported. The upper vagina is without lesions.  Uterus and cervix are surgically absent. Bimanual and rectovaginal  examinations  reveal no masses or nodularity.   LABORATORY DATA:  Review of CT scan and PET/CT performed in July 2006  confirmed slow progression of her left paraaortic and left pelvic  lymphadenopathy, without visceral metastases. I reviewed the patient's  hematologic flow with Dr. Darnelle Catalan and it is noted that she has not been  treated in over six months because of thrombocytopenia.   ASSESSMENT:  Recurrent cervical carcinoma, stable to slowly progressive.  Protracted thrombocytopenia, question flare of lupus.   PLAN:  I had a long discussion with Dr. Darnelle Catalan and we will try to have her  evaluated by a rheumatologist. Perhaps she needs to increase her steroids.  In regards to active treatment of recurrent cervical  cancer, I believe that  weekly topotecan would be my best recommendation and Dr. Darnelle Catalan is in  agreement with this. Depending on her disease status, we could see her  within the next 4-6 months if she gets started on chemotherapy.      John T. Kyla Balzarine, M.D.  Electronically Signed     JTS/MEDQ  D:  02/19/2005  T:  02/20/2005  Job:  [id number]   cc:   Valentino Hue. Magrinat, M.D.  [address] [facility]  [place]  [place] [address]  Fax: [phone]   Billie Lade, M.D.  [address] [facility]  [place]  [place] [address]  Fax: [phone]   Leonie Man, M.D.  [address]  [address]  [place]  [place] [address]   Jarome Matin, M.D.  [address] [place]  [place] [address]  Fax: [phone]   Jeralyn Ruths, M.D.   Telford Nab, R.N.  [address]  [place], [place] [address]

## 2010-12-07 NOTE — Consult Note (Signed)
NAME:  Kathleen [name], Kathleen [name]                          ACCOUNT NO.:  [id number]   MEDICAL RECORD NO.:  [id number]                   PATIENT TYPE:  OUT   LOCATION:  GYN                                  FACILITY:  [facility]   PHYSICIAN:  John T. Kyla Balzarine, M.D.                 DATE OF BIRTH:  01-06-1961   DATE OF CONSULTATION:  DATE OF DISCHARGE:                                   CONSULTATION   CHIEF COMPLAINT:  Followup of abnormal Pap smear in the setting of prior  surgery and radiation.   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 She  received postoperative radiation with concurrent cisplatin and has been  followed without evidence of disease but has had multiple Pap smears  revealing low grade squamous intraepithelial lesion. She was on Premarin  orally and topically through April 2004. Cytology obtained May 02, 2003  revealed persistent low grade SIL with a few cells suggestive of a higher  grade lesion. The patient was returned for evaluation.   Additionally, the patient had diagnosis of a right ductal carcinoma which  has been treated with wide radical resection and negative sentinel node  dissection. Further treatment has not yet been given.   Otherwise past history, personal social history, family history and review  of systems are unchanged from intake evaluation in 2000.   ALLERGIES:  ASPIRIN, CODEINE, and PYRIDIUM.   PHYSICAL EXAMINATION:  VITAL SIGNS:  Blood pressure 110/80, weight 188  pounds and patient afebrile.  ABDOMEN:  Soft and benign with well healed incision. There is no ascites,  tenderness, hernia, mass or organomegaly.  EXTREMITIES:  Full range of motion without edema. There is no groin  adenopathy.  PELVIC:  External genitalia and BUS are normal to inspection. The vagina has  atrophic mucosa, with telangiectatic change at the apex. Bimanual and  rectovaginal  examinations reveal no mass or nodularity, absent uterus and  cervix. The cuff has extremely delicate mucosa.   DESCRIPTION OF PROCEDURE:  After informed consent obtained, I performed  colposcopy of the entire vagina using acetic acid staining. No acetowhite  lesions were seen. There was denuded/granular epithelium at the apex with  radiation telangiectasia; biopsy obtained.   ASSESSMENT:  Abnormal Pap smear in the setting of prior surgery and  radiation for cervical cancer and low estrogenic state. New diagnosis of  breast cancer.   PLAN:  Final dispensation will await the results of the biopsy. If this is  negative for significant dysplasia, I would recommend reinstituting Premarin  vaginal cream at least weekly, as this would not substantially increase  systemic estrogen levels and might improve interpretation of her cytology in  the future. Pap smears should be repeated every six months and Dr. Lilian Coma  group could followup with this with [place] seeing the patient for followup in  one year as long as her Pap smear does not yield a diagnosis worse than low  grade SIL.                                               John T. Kyla Balzarine, M.D.    JTS/MEDQ  D:  06/08/2003  T:  06/09/2003  Job:  [id number]   cc:   Dois Davenport A. Rivard, M.D.  [address]  [place]  [place] [address]  Fax: [phone]   Dr. Okey Regal _________________   Jeralyn Ruths, M.D.   Telford Nab, R.N.  [address]  [place], [place] [address]

## 2010-12-07 NOTE — Op Note (Signed)
NAME:  Kathleen [name], Kathleen [name]                          ACCOUNT NO.:  [id number]   MEDICAL RECORD NO.:  [id number]                   PATIENT TYPE:  OIB   LOCATION:  2550                                 FACILITY:  [facility]   PHYSICIAN:  Leonie Man, M.D.                DATE OF BIRTH:  1961/05/31   DATE OF PROCEDURE:  07/19/2003  DATE OF DISCHARGE:                                 OPERATIVE REPORT   PREOPERATIVE DIAGNOSIS:  Poor venous access, status post carcinoma, right  breast.   POSTOPERATIVE DIAGNOSIS:  Poor venous access, status post carcinoma, right  breast.   OPERATION PERFORMED:  Implanted Bard port #[id number] via the left subclavian.   SURGEON:  Leonie Man, M.D.   ASSISTANT:  Nurse.   ANESTHESIA:  MAC.  I used 1% lidocaine plain and 0.25% Marcaine with  epinephrine 1:200,000.   INDICATIONS FOR PROCEDURE:  The patient is a 50 year old woman with recently  diagnosed breast cancer who is being prepared for chemotherapy.  She comes  to the operating room now for placement of an implanted port to access her  venous system for chemotherapy.  The risks and potential benefits of surgery  have been full discussed, all questions answered and consent obtained.   DESCRIPTION OF PROCEDURE:  Following satisfactory sedation with the patient  positioned supine in Trendelenburg position, the left anterior chest was  prepped and draped to be included in the sterile operative field.  I  infiltrated the left subclavian region with plain lidocaine, made a  subclavian stick into that area into the subclavian vein.  I threaded the  guidewire into the venous system under fluoroscopic control placing the tip  in the right atrium.  A pocket was then created on the anterior chest wall  following which a Silastic catheter was brought up from the pocket to the  shoulder wound.  I used the [facility] introducer dilator type mechanism to place  the catheter over the guidewire and position it in the central  venous system  under fluoroscopic guidance.  Subsequently I removed the guidewire and  dilator and put the catheter in the central venous system positioning it at  the atrial vena caval junction.  The inflow of heparinized saline and  outflow of blood was noted to be excellent.  I then attached the reservoir  to the external portion of the catheter and sewed the reservoir into the  pocket using 2-0 silk sutures.  The reservoir was seated well.  Inflow of  heparinized saline through the reservoir and blood return was noted to be  excellent.  With the bed of the reservoir well seated and sutured in place,  sponge, instrument and sharp counts were verified and the wound was closed  with 3-0 Vicryl sutures in the subcutaneous tissues of all wounds and 4-0  Monocryl in the subcuticular placed to close the skin which was reinforced  with Steri-Strips.  The  port was accessed with a Huber needle and this  access was left in place for further chemotherapy tomorrow.  The patient was  then removed from the operating room to the recovery room in stable  condition after sponge, instrument and sharp counts were verified and  sterile dressings were placed on the wounds.                                               Leonie Man, M.D.    PB/MEDQ  D:  07/19/2003  T:  07/19/2003  Job:  [id number]

## 2010-12-07 NOTE — Op Note (Signed)
NAME:  Kathleen [name], Kathleen [name] NO.:  [id number]   MEDICAL RECORD NO.:  [id number]          PATIENT TYPE:  AMB   LOCATION:  DAY                          FACILITY:  [facility]   PHYSICIAN:  Lindaann Slough, M.D.  DATE OF BIRTH:  1961-06-10   DATE OF PROCEDURE:  DATE OF DISCHARGE:                               OPERATIVE REPORT   PREOPERATIVE DIAGNOSIS:  Left hydronephrosis secondary to  retroperitoneal adenopathy due to cervical cancer.   POSTOPERATIVE DIAGNOSIS:  Left hydronephrosis secondary to  retroperitoneal adenopathy due to cervical cancer.   PROCEDURES DONE:  1. Cystoscopy.  2. Left retrograde pyelography.  3. Insertion of a Polaris stent (8 x 24).   INDICATIONS:  This is a 50 year old lady who was diagnosed in 2000 with  cervical cancer.  She was treated with a hysterectomy, followed by  chemoradiation.  CT scan of the abdomen and pelvis recently showed left  hydronephrosis and left retroperitoneal adenopathy.  She had a double-J  stent inserted.  The last surgery was done on February 24, 2006.  She  presents today for a catheter change.   DESCRIPTION OF PROCEDURE:  Patient was brought to the operating room.  She was placed in the dorsal lithotomy position.  General anesthesia was  induced.  Preoperative antibiotics were given.  A time-out was taken to  properly identify the patient and procedure going to be done.  Bilateral  SCDs were applied, and pressure points were applied appropriately to  avoid neuropathy or compartment syndrome.   CYSTOSCOPY:  A 22 French sheath cystoscope was then used to access the  bladder.  The left ureteral orifice was identified, and the stent was  seen to be coming out of the left UO.  There was bullus edema at the  left ureteral orifice.  There were some calcifications noted at the end  of the stent.  The distal stent was grabbed by a grasper and was pulled  out.  Attempt at passing a wire through the stent failed due to  calcifications.  At this point, the entire stent was taken out.   RETROGRADE PYELOGRAM:  A cone-tipped catheter was placed at the left UO.  A cone-tip catheter was placed at the left UO, and contrast was  injected, opacifying the entire left collecting system.  There were some  filling defects at the proximal end of the ureter, which could be blood  clots or calculi from the proximal end of the stent.  The cone-tipped  catheter was then taken out.   INSERTION OF URETERAL STENT:  After taking out the cone tip, a Sensor  wire was then advanced through the left ureteral orifice to the level of  the left renal pelvis.  An 8 x 24 Polaris stent was then advanced under  direct and fluoroscopic guidance.  Once the pressure was seen, the wire  was taken out and a good proximal coil was noted in the proximal end of  the stent.  The distal end of the stent was seen clearly in the bladder.  The bladder was then  emptied, and the procedure terminated  with no complications.  Patient  was extubated in the operating room and taken in stable condition to the  PACU.   Dr. Brunilda Payor was present and participated in the entire procedure, as he was  the responsible surgeon.     ______________________________  Kathleen Bryant, MD      Lindaann Slough, M.D.  Electronically Signed    SK/MEDQ  D:  09/24/2006  T:  09/24/2006  Job:  [id number]

## 2010-12-07 NOTE — Consult Note (Signed)
[facility]  Patient:    Kathleen [name], Kathleen [name]                       MRN: [id number] Proc. Date: 05/06/00 Adm. Date:  [id number] Disc. Date: [id number] Attending:  Mingo Amber CC:         Deniece Ree, M.D./Charles Hector Brunswick, M.D.  Billie Lade, M.D./Nancy Alessandra Grout. Myna Hidalgo, M.D./Robert Stephanie Coup, M.D.   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  HISTORY OF PRESENT ILLNESS: Since last seen her GI symptoms have markedly improved.  She has rare lower GI symptoms but denies obstructive type symptoms.  She has normal bladder function and denies vaginal bleeding or leg swelling.  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  She completed chemo-radiation with cisplatin in October 2000 and follow-up CT scan in November and April 2001 showed no evidence of disease.  PAST MEDICAL HISTORY: No significant comorbidities.  Question migraine headaches.  CURRENT MEDICATIONS: Prempro.  ALLERGIES:  1. ASPIRIN.  2. CODEINE.  3. PYRIDIUM.  SOCIAL HISTORY: The patient admits social use of ethanol and more than one pack per day tobacco use.  FAMILY HISTORY: Noncontributory.  REVIEW OF SYSTEMS: As above.  PHYSICAL EXAMINATION:  VITAL SIGNS: Weight 192 pounds.  Blood pressure 118/88.  Vital signs stable, afebrile.  GENERAL: The patient is alert and oriented x 3 and in no acute distress.  HEENT: Benign, with clear oropharynx.  NECK: Supple, without goiter.  LYMPHATICS: No pathologic lymphadenopathy.  No back or CVA tenderness.  ABDOMEN: Soft, benign, without ascites or mass.  EXTREMITIES: Full range of motion, without edema.  Good strength.  PELVIC: External genitalia and BUS clear.  Vagina has good support,  with thinning noted of the mucosa with radiation change at the apex, but no mucosal lesions.  Bimanual and rectovaginal examinations reveal absent uterus and cervix, without mass or nodularity.  LABORATORY DATA: Abdominal/pelvic CT scan performed earlier this week reveals no evidence of metastatic disease.  ASSESSMENT: Stage 1B-1 carcinoma of the cervix.  PLAN: We can continue to see the patient at approximately six month intervals for pelvic examinations.  Cytology is obtained and will be communicated to the patient. DD:  05/06/00 TD:  05/06/00 Job: [id number]

## 2010-12-07 NOTE — Consult Note (Signed)
   NAME:  Kathleen [name], TATRO                          ACCOUNT NO.:  [id number]   MEDICAL RECORD NO.:  [id number]                   PATIENT TYPE:  OUT   LOCATION:  GYN                                  FACILITY[facility]  [facility]   PHYSICIAN:  De Blanch, M.D.         DATE OF BIRTH:  1961/04/02   DATE OF CONSULTATION:  10/19/2002  DATE OF DISCHARGE:                                   CONSULTATION   HISTORY OF PRESENT ILLNESS:  A 50 year old African-American female returns  for evaluation of a low grade squamous intraepithelial Pap smear.   Since I saw her in February she has had no interval problems.   PAST MEDICAL HISTORY:  The patient has a past history of cervical cancer  undergoing an abdominal hysterectomy in 2000 with findings of a  adenosquamous carcinoma of the cervix.  She subsequently received  postoperative radiation therapy and concurrent cisplatin chemotherapy.   Past medical history is negative.   PAST SURGICAL HISTORY:  Hysterectomy and pelvic radiation therapy.   CURRENT MEDICATIONS:  Premarin, Premarin vaginal cream.   ALLERGIES:  ASPIRIN, CODEINE, PYRIDIUM.   PROCEDURE NOTE:  Colposcopic examination of the entire vagina is performed.  No lesions are noted.   IMPRESSION:  Low grade SIL Pap smear with no abnormalities noted on  colposcopy.   We will let the patient return to see Hal Morales, M.D. in six months  and have a repeat Pap smear at that time.  She will return to see me in one  year.                                               De Blanch, M.D.    DC/MEDQ  D:  10/19/2002  T:  10/19/2002  Job:  [id number]   cc:   Hal Morales, M.D.  [address]  [place]  [place] [address]  Fax: [phone]   Telford Nab, R.N.

## 2010-12-07 NOTE — Discharge Summary (Signed)
NAMEARIEN, MORINE NO.:  [id number]   MEDICAL RECORD NO.:  [id number]          PATIENT TYPE:  INP   LOCATION:  1309                         FACILITY:  [facility]   PHYSICIAN:  Valentino Hue. Magrinat, M.D.DATE OF BIRTH:  Mar 17, 1961   DATE OF ADMISSION:  05/31/2006  DATE OF DISCHARGE:                               DISCHARGE SUMMARY   DISCHARGE DIAGNOSES:  1. Urosepsis.  2. Obtundation secondary to narcotics.  3. Metastatic cervical cancer, status post chemotherapy, status post      radiation therapy.  4. History of lupus.  5. 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  6. Pancytopenia likely secondary to hydroxychloroquine.  7. Diarrhea, resolved.  8. Anemia requiring transfusion.  9. History of breast cancer.   PROCEDURES:  1. Transfusion of blood products.  2. CT scan of the abdomen and pelvis on June 01, 2006.   HOSPITAL COURSE:  The patient was admitted through the emergency room on  May 31, 2006, with pyuria, a fever of 102 degrees, obtundation and  pancytopenia.  She was hydrated, treated with antibiotics intravenously  and then orally and aggressively transfused 4 units of packed cells.  The patient's admission hemoglobin was 3.4.  The hematocrit was 9.9.  The MCV was 116.5, total white cell count of 1.7 and the platelet count  13,000.  The patient has been anemic in the past, but not to this  degree.  She has been thrombocytopenic in the past, but not to this  degree and the feeling is that perhaps the hydroxychloroquine she was  taking was responsible for this.  That medication was stopped.  The  patient was given 4 units of blood with her hemoglobin rising to 8.3  after 4 units.  Her white cell count has risen to her usual value of  3.8.  Her platelet count remains low at 14,000.  There has been no  evidence of active bleeding.   The anemia was also evaluated with haptoglobin which was normal, a  folate which was normal at 10.1 and a reticulocyte  count which was low  at 35.  Her coags were unremarkable with a PT of 17.7 seconds, INR 1.4,  PTT 35.  Her ferritin was 1465 and her B12 is pending at this time.  Essentially there is no evidence of hemolysis and the patient is felt to  have  1. Anemia of chronic illness.  2. Anemia secondary to medication, namely hydroxychloroquine.   The patient's mental status improved significantly after she was taken  off narcotics.  At the time of discharge the patient is alert, well  oriented, very responsive and her usual wonderful self.  She did have  some diarrhea, possibly sparked by the CT scans that we obtained to  restage her cervical cancer, but that has resolved.  And she has been  afebrile for the past 72 hours and her blood pressure has improved to  the current 111/56 with an oxygen saturation on room air of 99%.   CT scans of the pelvis and abdomen were obtained on June 01, 2006.  There was a small right pleural effusion  with some atelectatic changes  in both lower lobes.  The liver has a generally low-attenuation with a  patchy appearance.  This could be due to congestion, fatty infiltration  or a potentially early metastatic disease.  There is distension of the  inferior vena cava.  This had been noted previously, but has progressed.  The spleen, kidneys and adrenals were unremarkable.  There was abnormal  soft tissue density in the porta hepatis and the portacaval region  suspicious for tumor infiltration and also around the portal vein and  contiguous with the superior mesenteric artery.  There were some  enlarged upper retroperitoneal lymph nodes and in general, there is  evidence of disease progression when compared to prior exam.  The  patient is status post right total hip replacement making CT of the  pelvis hard to interpret.  She has a left ureteral stent in place.  There is free pelvic fluid noted, mild-to-moderate, and abnormal soft-  tissue between the right  external and internal iliac vessels.  There is  tumor adjacent to the iliac vessels, left greater than right.  Again,  all this shows evidence of progression as compared with prior.   When the patient was essentially obtunded, bed-bound and appeared  unlikely to survive more than a few weeks if that long, arrangements  were made for placement at [facility] and the patient was willing and  her family very positive regarding that possibility.  However, the  patient has improved sufficiently that she can get up out of bed with  minimal assistance, can walk with a walker, get herself to the bathroom,  the Foley has been removed, the diarrhea has resolved and her pain is  controlled without narcotics.  Accordingly at this point, I think  transfer to [facility] is premature and the plan is to discharge the  patient back home.  We are certainly making quite a few changes in her  medications and these should be noted as below.   As far as the patient's lupus and pancytopenia is concerned, we have put  her back on prednisone and we are stopping the hydroxychloroquine.   CONDITION ON DISCHARGE:  Improved.   DISCHARGE MEDICATIONS:  1. She will use Neurontin 300 mg at bedtime and tramadol 50 mg up to      four times a day as needed for pain.  2. She will take metoclopramide 10 mg before meals as needed.  3. Caltrate twice daily.  4. Prilosec 20 mg daily.  5. Prednisone 20 mg in the morning.  6. Ondansetron 8 mg twice a day as needed for nausea.  7. Nitrofurantoin 100 mg daily.  8. She will not take Percocet, OxyContin, Risperdal, hyoscyamine,      hydroxychloroquine, meclizine, Norvasc, Zyrtec, Diflucan,      metronidazole or diazepam.  All those medications really should be      removed from the home so the patient does not get confused      regarding what she can take and not take.   DIET:  Unrestricted.   ACTIVITY:  Walking with assistance and she is to use a walker at  all times.   SPECIAL INSTRUCTIONS:  She will call for pain, fever, nausea, diarrhea  or any other problem.   FOLLOW UP:  She will see me in January, she will call for that  appointment.  Hospice will continue to visit the patient as before.      Valentino Hue. Magrinat, M.D.  Electronically  Signed     GCM/MEDQ  D:  06/05/2006  T:  06/05/2006  Job:  [id number]   cc:   [facility]

## 2010-12-07 NOTE — Discharge Summary (Signed)
Kathleen [name], BOULTINGHOUSE NO.:  [id number]   MEDICAL RECORD NO.:  [id number]          PATIENT TYPE:  INP   LOCATION:  1330                         FACILITY:  [facility]   PHYSICIAN:  Valentino Hue. Magrinat, M.D.DATE OF BIRTH:  [birth year]   DATE OF ADMISSION:  09/10/2006  DATE OF DISCHARGE:                               DISCHARGE SUMMARY   DISCHARGE DIAGNOSES:  1. Uncontrolled pain.  2. Hematuria possibly due to problems with the left renal stent.  3. History of cervical cancer diagnosed August 2002 status post total      abdominal hysterectomy, status post radiation and chemotherapy,      with recurrence involving the left periaortic lymph node area      September 2005.  4. History of left hydronephrosis status post stenting.  5. History of systemic lupus erythematosus.  6. 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  7. History of right-sided breast carcinoma diagnosed November of 2004      status post right lumpectomy followed by chemotherapy followed by      Taxol and radiation with no evidence of disease recurrence to date.  8. Chronic moderate thrombocytopenia.  9. Chronic anemia requiring transfusion.   PROCEDURES:  Transfusion of blood products, MRI of the lumbar spine,  intravenous analgesics.   HOSPITAL COURSE:  The patient was admitted with uncontrolled pain on  February 20.  She was initially treated with intravenous morphine and  switched over to MS Contin with other medicines for breakthrough as  reflected in the discharge medication list. On this new regimen, the  patient's overall pain has dropped from 8-10 to proximately 2-3 and as  far as the pain is concerned this can be managed at home.   To evaluate the pain, the patient had MRI of the lumbar spine.  We  expected to find either metastatic disease to the spine or a compression  fracture, actually we do not see either of those things. The  retroperitoneal lymph node mass is still present.  It does not seem to  have changed  significantly.   Accordingly other causes of the pain have been sought. This is in the  left side which is the side where she has the lymph nodes and also the  stent. She has had some hematuria this admission so we have obtained a  urine culture with final results pending and we have asked her to see  Dr. Brunilda Payor who originally placed the stent for consideration of stent  revision if indicated.   The patient's hemoglobin was 6.9 on February 21. She was transfused 2  units of packed red cells and at the time of discharge her hemoglobin is  9.3.  Her platelet count is about at baseline at 71,000.  White cell  count is 6.3 thousand.   The patient was afebrile through the admission. Her discharge vital  signs show a pulse of 87, respiratory rate 20, blood pressure 130/73 and  oxygen saturation of 100%   CONDITION ON DISCHARGE:  Improved.   DISCHARGE MEDICATIONS:  1. Vicodin 1-2 tablets every 4 hours as needed.  2. MS Contin 30 mg  twice daily.  3. Xanax 0.25 mg every 6 hours as needed.  4. Neurontin 300 mg three times a day.  5. Ultram 50 mg every 6 hours as needed.  6. Prilosec 20 mg daily.  7. Reglan 10 mg before meals and at bedtime as needed.  8. Nitrofurantoin 100 mg daily which is a chronic medication for her.  9. Prednisone 20 mg every morning with breakfast.  10.Prozac 20 mg daily.  11.Calcium twice a day.   The patient is now off Celebrex.   DIET:  Unrestricted.   ACTIVITY:  Walking with assistance.   WOUND CARE:  Not applicable.   SPECIAL INSTRUCTIONS:  She must have a bowel movement at least every  other day and she will call if her pain is not well-controlled on her  current medications. Hospice will continue to follow the patient on a  regular basis as before. She should not be resuscitated in case of a  terminal event.   FOLLOW UP:  She will see Dr. Brunilda Payor on Monday, February 25 and she will  see me again the first week in March.      Valentino Hue. Magrinat,  M.D.  Electronically Signed     GCM/MEDQ  D:  09/12/2006  T:  09/12/2006  Job:  [id number]   cc:   Lindaann Slough, M.D.  Fax: [phone]   [facility]

## 2010-12-07 NOTE — Consult Note (Signed)
Kathleen [name], HILSCHER                          ACCOUNT NO.:  [id number]   MEDICAL RECORD NO.:  [id number]                   PATIENT TYPE:  OUT   LOCATION:  GYN                                  FACILITY:  [facility]   PHYSICIAN:  John T. Kyla Balzarine, M.D.                 DATE OF BIRTH:  05-24-1961   DATE OF CONSULTATION:  03/17/2002  DATE OF DISCHARGE:                                 GYN CONSULTATION   CHIEF COMPLAINT:  Follow-up of cervical carcinoma with recent history of  hematuria.   HISTORY OF PRESENT ILLNESS:  The patient had a cut through hysterectomy,  ultimately treated with chemo/radiation which she completed in October 2000.  Follow-up since has been without evidence of recurrent disease.  She is  using both oral and vaginal Premarin.  In the interim since she was last  seen she relates approximately six weeks of intermittent vaginal bleeding or  hematuria.  She mainly notes blood in the commode after she urinates.  She  denies gross vaginal bleeding or postcoital bleeding.  She denies dysuria,  but continues to have frequency.  She had an IVP at [facility]  last week and results are available for review.   PAST MEDICAL HISTORY:  Essentially negative.   PAST SURGICAL HISTORY:  Hysterectomy.   MEDICATIONS:  1. Premarin 1.25 mg daily.  2. Premarin vaginal cream q.Monday and Thursday.   ALLERGIES:  ASPIRIN, CODEINE, PYRIDIUM.   SOCIAL HISTORY:  Unchanged from our initial evaluations.   FAMILY HISTORY:  Unchanged from our initial evaluations.   REVIEW OF SYMPTOMS:  Unchanged from out initial evaluations.   PHYSICAL EXAMINATION:  GENERAL:  The patient is anxious, alert, and oriented  x3 in no acute distress.  VITAL SIGNS:  Weight 184 pounds.  HEENT:  Benign with clear oropharynx.  NECK:  Supple without thyromegaly.  LYMPH NODES:  Survey is negative with no supraclavicular or inguinal  adenopathy.  ABDOMEN:  Soft and benign with minimal tenderness in the mid  abdomen.  There  is no suprapubic tenderness.  There is no ascites, mass, or organomegaly.  PELVIC:  External genitalia and BUS are normal to inspection and palpation.  The vagina is clear without mucosal lesions other than slight erythema at  the vaginal apex.  Bimanual and rectovaginal examinations reveal absent  uterus and cervix without mass, induration, or nodularity.  Rectal  examination is confirmatory.   LABORATORIES:  IVP is reviewed and there is no evidence of obstruction,  stones, or intravesicle tumor.   ASSESSMENT:  Cervical carcinoma, NED, hormonal replacement, hematuria.   PLAN:  The patient will continue oral and vaginal estrogen.  She will follow  up with her primary care physician regarding her hematuria and may well  require a urology evaluation.  She should continue to be evaluated at six  month intervals.  John T. Kyla Balzarine, M.D.    JTS/MEDQ  D:  03/17/2002  T:  03/18/2002  Job:  [id number]   cc:   Hal Morales, M.D.   Telford Nab, R.N.

## 2010-12-07 NOTE — Discharge Summary (Signed)
Kathleen [name], DIMITROFF NO.:  [id number]   MEDICAL RECORD NO.:  [id number]          PATIENT TYPE:  INP   LOCATION:  1620                         FACILITY:  [facility]   PHYSICIAN:  Lindaann Slough, M.D.  DATE OF BIRTH:  1961/01/03   DATE OF ADMISSION:  09/24/2006  DATE OF DISCHARGE:  09/26/2006                               DISCHARGE SUMMARY   DISCHARGE SUMMARY   DISCHARGE DIAGNOSIS:  Left hydronephrosis secondary to retroperitoneal  adenopathy secondary to metastatic cervical cancer.   PROCEDURE:  Cystoscopy and replacement of left double-J catheter and  left retrograde pyelogram on September 24, 2006.   The patient is a 50 year old female with left hydronephrosis secondary  to retroperitoneal mass secondary to cervical cancer who had a left  double-J stent exchange on September 24, 2006.  Postoperatively she was  unable to urinate.  She was in-and-out catheterized every 4-6 hours.  She continued to have difficulty voiding and had gross hematuria with  blood clots.  On September 25, 2006, a Foley catheter was left indwelling.  On the evening of March 7, she spiked a temperature to 102.5 and on  March 7 at 10:00 in the morning the temperature was down to 99.8.  She  was feeling better.  She did not have any significant flank pain, and  the Foley catheter was draining well and the urine was clearing up.  She  was then discharged home with the Foley catheter in place, on Cipro 500  mg twice a day and she will be followed in the office on Monday.  The  Foley catheter will then be removed if the urine is clear.      Lindaann Slough, M.D.  Electronically Signed     MN/MEDQ  D:  09/26/2006  T:  09/26/2006  Job:  [id number]

## 2010-12-07 NOTE — H&P (Signed)
[facility]  Patient:    Kathleen [name], GLANZER                       MRN: [id number] Adm. Date:  [id number] Attending:  Sabino Donovan CC:         Rose Phi. Myna Hidalgo, M.D. - Telford Nab, N.P.             Deniece Ree, M.D.             Billie Lade, M.D.             Jarome Matin, M.D.             Florencia Reasons, M.D.                         History and Physical  CHIEF COMPLAINT:  [name] returns for ongoing followup of cervical carcinoma,  stage IB1 adenosquamous carcinoma.  INTERVAL NOTE:  Since we last saw her in January, the patient has noted continued epigastric "burning" discomfort, denying gross reflux symptoms.  She had an abdominal ultrasound which revealed fatty change in the liver and questionable echogenic foci compatible with sludge or small gallstones.  She was to have a GI workup but because of a family emergency, this has not been scheduled.  She also notes an episode of transient left-sided weakness and what sounds like hyperventilation with periorbital numbness, evaluated in the emergency room with a provisional diagnosis of migraine headache after negative scans.  The patient does not have recurrence of this.  She has occasional loose stools but denies gross diarrhea or obstructive-type symptoms.  She does note very mild urinary urgency, and both her lower GI and urinary symptoms are improving over the past several months.  She does note vaginal dryness despite Prempro.  She denies leg swelling.  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  She completed chemotherapy, irradiation  with cisplatin in October 2000, and follow-up CT scans in November, and more recently in April were NED.  PAST MEDICAL HISTORY:  No significant comorbidities.  Questionable  migraine headaches.  CURRENT MEDICATIONS:  Prempro.  ALLERGIES:  ASPIRIN, CODEINE, PYRIDIUM.  PERSONAL/SOCIAL HISTORY:  The patient admits social ethanol and more than one pack per day tobacco use.  FAMILY HISTORY:  Noncontributory.  REVIEW OF SYSTEMS:  As above.  PHYSICAL EXAMINATION:  VITAL SIGNS:  Weight 182 pounds, blood pressure 104/74, afebrile, vital signs stable.  GENERAL:  The patient is alert and oriented x 3, in no acute distress.  HEENT:  Benign with clear oropharynx.  NECK:  Supple without goiter.  There is no pathologic lymphadenopathy.  LUNGS:  Fields are clear.  BACK:  There is no back or CVA tenderness.  ABDOMEN:  Soft and benign, without ascites or mass.  EXTREMITIES:  Full range of motion without edema, and good strength.  SKIN:  There is no worrisome skin lesion.  GENITOURINARY/PELVIC/RECTAL:  External genitalia and BUS are clear.  The vagina has good support and no mucosal lesions.  Slight thinning of the mucosa.  Bimanual nd rectovaginal examinations reveal absent uterus and cervix without mass or nodularity.  Stool is guaiac-negative.  ASSESSMENT: 1. Cervical carcinoma stage IB1, no evidence of disease. 2. 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  PLAN:  It  is recommended that the patient follow through with her GI appointment. She will be seeing Dr. Rose Phi. Ennever for followup later this summer.  I would like to see her back for routine followup in approximately four months.  For routine followup of stage IB cervical carcinoma, the current recommendation is a clinical examination, including a pelvic examination and Pap smear, which should be obtained at least every six months.  There have been several studies that have not demonstrated the efficacy for routine CT scans.  These should be obtained for symptoms suggesting recurrent disease, including low back pain, leg swelling, or the finding of  abnormalities on pelvic examination. DD:  01/16/00 TD:  01/16/00 Job: [id number]

## 2010-12-07 NOTE — Consult Note (Signed)
Kathleen [name], Kathleen [name] NO.:  [id number]   MEDICAL RECORD NO.:  [id number]          PATIENT TYPE:  OUT   LOCATION:  GYN                          FACILITY:  [facility]   PHYSICIAN:  Paola A. Duard Brady, MD    DATE OF BIRTH:  01/04/1961   DATE OF CONSULTATION:  09/25/2004  DATE OF DISCHARGE:                                   CONSULTATION   HISTORY OF PRESENT ILLNESS:  Kathleen [name] is a very pleasant 50 year old with  a very complicated past  medical history.  She has a history of having been  diagnosed with cervical carcinoma in 2000.  At that time she underwent  surgery followed by radiation with concurrent Cisplatin chemotherapy.  She  subsequently in 2001 was diagnosed with breast cancer.  In addition in  October of 2005 she was diagnosed with lupus.  She was doing well until  September of 2005 which was almost five years out from her diagnosis where  she had fatigue and back pain and was noted to have progressive  retroperitoneal lymphadenopathy at the lumbosacral junction.  She had a 2.9  x 1.8 cm node.  FNA was performed __________carcinoma and was consistent  with her primary carcinoma.  She was subsequently treated with single agent  Taxol but had progressive neuropathy and was switched to weekly Carboplatin  at an AUC of 2.  Her last dose of chemotherapy was February of 2001.  The  last imaging we have consists of a bone scan from August 23, 2004 which  reveals new focus of abnormal activity in the medial aspect of the right  acetabulum.  They could not rule out metastatic disease.  She had a CT scan  of the abdomen and pelvis August 10, 2004 which revealed progressive left-  sided retroperitoneal  adenopathy.  The lymph nodes maximum in diameter 1.3  x 1.2 cm, 1.3 x 1.6 cm, and 3.2 x 1.9 cm.  She has had no additional therapy  since August 22, 2004.  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  She was hospitalized at [facility] from February 13 to  February 24 and was at rehab until March 1.  She recently has just gone home  and is working at home with physical therapy.  She comes in to reinstitute  plan of care.  She does feel like she is getting better and stronger.  She  still feels somewhat weak and debilitated.   PHYSICAL EXAMINATION:  VITAL SIGNS:  Weight is 206 pounds, blood pressure  118/82.  GENERAL:  Well-nourished, well-developed female in no acute distress.  NECK:  Supple.  There is no lymphadenopathy, no thyromegaly.  LUNGS:  Clear to auscultation bilaterally.  CARDIOVASCULAR:  Regular rate and rhythm.  ABDOMEN:  Soft, nontender, nondistended.  There are no palpable masses or  hepatosplenomegaly.  EXTREMITIES:  Groins are negative for adenopathy.  Right hip shows a well  healing incision.  PELVIC:  Pelvic was deferred secondary to recent  hip surgery and inability  to be placed in stirrups.   ASSESSMENT:  A 51 year old with history of breast cancer and recurrent  cervical carcinoma who has had a hiatus in therapy secondary to recent hip  replacement.   PLAN:  I spoke with Dr. Darnelle Catalan regarding therapy.  We will get her set up  for scans consisting with CT scan of the abdomen and pelvis on March 13,  which is the earliest the patient could come.  We will follow up with the  results of that and determine disposition and additional therapy pending  that.  We may consider a __________secondary to a beneficial side effect  profile.  This will be done off protocol as she will not be eligible for any  GOG protocols because of her recent breast cancer history and prior therapy.      PAG/MEDQ  D:  09/25/2004  T:  09/25/2004  Job:  [id number]   cc:   Valentino Hue. Magrinat, M.D.  [address] [facility]  [place]  [place] [address]  Fax: [phone]   Telford Nab, R.N.  [phone] [address]  [place], [place] [address]   Jackquline Denmark. Kyla Balzarine, M.D.    De Blanch, M.D.   Hal Morales, M.D.  Fax: [phone]   Jarome Matin, M.D.  [address] [place]  [place] [address]  Fax: [phone]   Leonie Man, M.D.  [address]  [place]  [place] [address]   Billie Lade, M.D.  [address] - [facility]  [place]  [place] [address]  Fax: [phone]   Jeralyn Ruths, M.D.

## 2010-12-07 NOTE — Op Note (Signed)
[name], Kathleen [name] NO.:  [id number]   MEDICAL RECORD NO.:  [id number]          PATIENT TYPE:  AMB   LOCATION:  DSC                          FACILITY:  [facility]   PHYSICIAN:  Leonie Man, M.D.   DATE OF BIRTH:  [birth year]   DATE OF PROCEDURE:  04/03/2005  DATE OF DISCHARGE:                                 OPERATIVE REPORT   PREOPERATIVE DIAGNOSIS:  Malfunctioning Port-A-Cath.   POSTOPERATIVE DIAGNOSIS:  Malfunctioning Port-A-Cath.   PROCEDURE:  Repositioning and revision of Port-A-Cath placement.   SURGEON:  Ballen.   ASSISTANT:  OR nurse.   ANESTHESIA:  MAC. I used 0.5% Marcaine with epinephrine.   NOTE:  The patient is a 50 year old woman with diagnosed breast cancer  currently being readied for chemotherapy. She had a Port-A-Cath placed for  same.  There has been some difficulty accessing the Port-A-Cath because this  partially flipped over onto its side in the pocket. The patient returns now  to the operating room after risks and potential benefits of surgery have  been discussed. All questions answered and consent obtained.   PROCEDURE:  Following adequate sedation the Port-A-Cath site was infiltrated  with 0.5% Marcaine with epinephrine. An elliptical incision made over this  old scar cicatrix and this was excised.  Dissection carried down to the [facility]-  A-Cath pocket which was opened.  Following opening of the pocket, it was  noted that the Port-A-Cath was turned onto its side with the posterior end  flipped upward. Dissection was carried down below the Port-A-Cath to remove  additional tissue all the way down to the chest wall. This allowed better  seating of the Port-A-Cath along the chest wall.  The sutures to the  suturing rim of the Port-A-Cath was then taken out and new sutures placed so  as to leave the Port-A-Cath flat on the chest wall. I then injected the  heparinized saline in and aspirated blood. This was noted to be good under  fluoroscopic guidance. There were no kinks, bends or turns within the course  of the catheter. Sponge, instrument and sharp counts were then verified.  Subcutaneous tissues closed with interrupted 3-0 Vicryl sutures. Skin closed  with 4-0 Monocryl sutures and reinforced with Steri-Strips. A sterile  dressing applied. Anesthetic reversed. The patient removed from the  operating room to the recovery room in stable condition. She tolerated the  procedure well.      Leonie Man, M.D.  Electronically Signed     PB/MEDQ  D:  04/03/2005  T:  04/03/2005  Job:  [id number]

## 2010-12-07 NOTE — Discharge Summary (Signed)
NAMESENORA, LACSON NO.:  [id number]   MEDICAL RECORD NO.:  [id number]          PATIENT TYPE:  INP   LOCATION:  0453                         FACILITY:  [facility]   PHYSICIAN:  Valentino Hue. Magrinat, M.D.DATE OF BIRTH:  March 30, 1961   DATE OF ADMISSION:  09/03/2004  DATE OF DISCHARGE:                                 DISCHARGE SUMMARY   ADDENDUM:  We have taken the patient off Celebrex because of a possible  interaction. We have changed her methyl prednisolone to prednisone 10 mg in  the morning with breakfast and 5 mg in the evening.  At the time of this  dictation, the patient's hemoglobin is stable at 11.4, her white cell count  is 7.3 and her platelets are 53,000. Her PT/INR is only 1.2 so she will  receive a dose of prophylactic Lovenox at 40 mg subcutaneously.  This is for  today only.   The patient is ready to be transferred to Dr. Lamar Benes' service at the  [facility] or [facility]  area at Dr. Thomasena Edis' discretion when a bed becomes  available.      GCM/MEDQ  D:  09/14/2004  T:  09/14/2004  Job:  [id number]

## 2010-12-07 NOTE — Consult Note (Signed)
NAMEBRAYLIN, FORMBY NO.:  [id number]   MEDICAL RECORD NO.:  [id number]          PATIENT TYPE:  OUT   LOCATION:  GYN                          FACILITY:  [facility]   PHYSICIAN:  John T. Kyla Balzarine, M.D.    DATE OF BIRTH:  1960-11-17   DATE OF CONSULTATION:  DATE OF DISCHARGE:                                   CONSULTATION   CHIEF COMPLAINT:  Metastatic cervical cancer.  Consultation requested  regarding recommendations for chemotherapy.   INTERVAL HISTORY:  Since the patient was last seen, she has persistent  fatigue, back pain, and on March 23, 2004 was found to have progressive  retroperitoneal lymphadenopathy at the lumbosacral junction suspicious for  metastatic disease with enlarged lymph nodes measuring 2.9 x 1.8 cm in the  largest node.  Fine needle aspirate biopsy revealed metastatic carcinoma  with occasional atypical keratinized cells and staining that appeared to be  from the patient's primary cervical carcinoma.  Because of the patient's  prior chemo and radiation exposure and prior chemotherapy for breast cancer,  the patient is seen for recommendations regarding management.  The patient  is discussed with Dr. Darnelle Catalan.   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  She received  postoperative radiation with concurrent cisplatin and was followed with no  evidence for recurrent disease.  Patient had a stage 2 right breast  carcinoma treated with right lumpectomy and sentinel node dissection  followed by four cycles of Adriamycin and Cytoxan and seven weekly doses of  Taxol followed by chest wall radiation.  The Taxol was discontinued  secondary to peripheral neuropathy, and she completed treatment for breast  cancer this summer.   PAST MEDICAL HISTORY:  Includes prior cervical and breast cancers, as above.  Prior cholecystectomy, tonsillectomy and adenoidectomy.   Active diagnoses of  hypertension and frequent bronchitis.   MEDICATIONS:  Valium p.r.n., Zoloft, Risperdal, Nexium, Norvasc, Coumadin,  Premarin vaginal cream, Neurontin for peripheral neuropathy.  She has been  using Vicodin or Darvocet for pain.   ALLERGIES:  1.  ASPIRIN.  2.  CODEINE.  3.  PYRIDIUM.   Otherwise, past medical history, social history, family history, and review  of systems are unchanged from those recorded during multiple evaluations  dating back to intake examination in 2000.   PHYSICAL EXAMINATION:  VITAL SIGNS:  Stable and afebrile.  Weight loss down  to 207 pounds.  Blood pressure 120/82.  GENERAL:  Patient is alert and oriented x 3 in no acute distress.  LYMPH:  No pathologic lymphadenopathy.  LUNGS:  Lung fields are clear.  MUSCULOSKELETAL:  There is no back or CVA tenderness.  ABDOMEN:  Soft, benign without ascites, mass, or organomegaly.  EXTREMITIES:  Full strength and range of motion without swelling.  PELVIC:  External genitalia and BUS are normal to inspection and palpation.  Bladder and urethra are well supported, and there are no vaginal lesions.  Bimanual and rectovaginal examinations reveal absent uterus and cervix with  no adnexal or parametrial mass/nodularity.  ASSESSMENT:  Recurrent cervical cancer in the left common iliac node.   RECOMMENDATIONS:  The following recommendations were discussed with both the  patient and with Dr. Darnelle Catalan:  I would not recommend radiation because of  the potential overlap and additive toxicities.  Likewise, this is not a node  that would be amenable to surgical resection for any kind of palliation.  Cisplatin, gemcitabine, and Topotecan are all active agents, and I would  favor treating her with biweekly Cisplatin/gemcitabine initially (cisplatin  dose 30 mg/m2) with Topotecan used for progression.  The rationale for this  recommendation was explained to the patient.  She will be set up to see Dr.   Darnelle Catalan in the near future to initiate therapy.     John   JTS/MEDQ  D:  04/23/2004  T:  04/23/2004  Job:  [id number]   cc:   Valentino Hue. Magrinat, M.D.  [address] [facility]  [place]  [place] [address]  Fax: [phone]   Telford Nab, R.N.  [phone] [address]  [place], [place] [address]   Jarome Matin, M.D.  [address] [place]  [place] [address]  Fax: [phone]   Leonie Man, M.D.  [address]  [place]  [place] [address]  Fax: [phone]   Billie Lade, M.D.  [address] - [facility]  [place]  [place] [address]  Fax: [phone]   Jeralyn Ruths, M.D.

## 2010-12-07 NOTE — Consult Note (Signed)
[facility]  Patient:    Kathleen [name], Kathleen [name]                       MRN: [id number] Proc. Date: 09/10/00 Adm. Date:  [id number] Disc. Date: [id number] Attending:  Jeannette Corpus CC:         Deniece Ree, M.D.  Jarome Matin, M.D.  Billie Lade, M.D.  Telford Nab, R.N.  Rose Phi. Myna Hidalgo, M.D.   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  The patient had undergone an abdominal hysterectomy at which time invasive carcinoma was noted.  She subsequently was treated with chemotherapy combined with radiation therapy in October 2000.  She had been followed since that time with no evidence of disease.  INTERVAL HISTORY:  Since her last visit with [place] she has done reasonably well. She notes she does have a considerable amount of anxiety, occasional headaches, and some dizziness and nausea in the mornings.  She denies any pelvic pain, pressure, ______ or discharge.  She has no other GI or GU symptoms.  CURRENT MEDICATIONS:  Prempro, although the patient feels as if she is having some fluid retention on this.  DRUG ALLERGIES:  ASPIRIN, CODEINE, PYRIDIUM.  SOCIAL HISTORY:  Unchanged.  FAMILY HISTORY:  Negative.  REVIEW OF SYSTEMS:  Negative.  PHYSICAL EXAMINATION:  VITAL SIGNS:  Weight 185-1/2 pounds.  Blood pressure 120/80.  GENERAL:  Healthy female, in no acute distress.  HEENT:  Negative.  NECK:  Supple.  Without thyromegaly.  There is no supraclavicular or inguinal adenopathy.  ABDOMEN:  Soft and nontender.  No masses, organomegaly, ascites, or hernias noted.  Her incision is well healed.  PELVIC:  EGBUS normal.  Vagina is clean, well supported.  Bimanual rectovaginal exam revealed no masses, induration, or nodularity.  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  No evidence of recurrent disease.  PLAN:  Pap smear was repeated.  The patient will be switched from Prempro to Premarin 0.9 mg daily to eliminate the progestin component of her hormone replacement therapy given that she has had a hysterectomy.  Hopefully, she will have less symptoms from this.  She will return to see [place] in six months for continuing follow-up. DD:  09/16/00 TD:  09/16/00 Job: [id number]

## 2010-12-07 NOTE — Op Note (Signed)
NAME:  Kathleen [name], Kathleen [name]                          ACCOUNT NO.:  [id number]   MEDICAL RECORD NO.:  [id number]                   PATIENT TYPE:  OIB   LOCATION:  2550                                 FACILITY:  [facility]   PHYSICIAN:  Leonie Man, M.D.                DATE OF BIRTH:  [birth year]   DATE OF PROCEDURE:  DATE OF DISCHARGE:                                 OPERATIVE REPORT   PREOPERATIVE DIAGNOSIS:  Carcinoma of the right breast.   POSTOPERATIVE DIAGNOSIS:  Carcinoma of the right breast.   PROCEDURE:  Right-sided lumpectomy and sentinel lymph node dissection.   SURGEON:  Leonie Man, M.D.   ASSISTANT:  Nurse.   ANESTHESIA:  General.   INDICATIONS FOR PROCEDURE:  This patient is a 50 year old woman with  recently diagnosed  right-sided breast cancer located in the lower inner  quadrant of the right breast. She is post core needle biopsy under  ultrasound guidance. She comes to the operating room now after the risks and  potential benefits of surgery have been discussed, all questions answered  and consent was obtained. On evaluation there is some flattening of the skin  of the breast in the lower inner quadrant and there is a palpable mass  within this area.   FINDINGS:  The mass was resected in its entirety with touch prep showing  margins free of tumor. Permanent sections are pending. The sentinel lymph  nodes and a 2nd hot blue lymph node on touch prep are noted to be negative  for tumor.   PROCEDURE:  Following  the induction of satisfactory general anesthesia the  patient was positioned supinely. The right breast was prepped and draped to  be included  in the sterile operative field. The patient had been previously  injected with radionucleotide dye in the periareolar region. After she was  asleep, I then injected 5 mL of Lymphazurin dye in the periareolar region. I  used the Neoprobe to locate an area in the axilla with high counts,  indicating the probable area  of the sentinel node.   The tumor which was palpable in the lower inner quadrant of the breast was  excised through an elliptical incision, taking with it the entire area of  skin  flattening, deepening the incision over the skin  and raising superior  and inferior flaps around the mass and taking the dissection all the way  down to the pectoralis major muscle. The mass was removed in its entirety,  and then  immediately  forwarded for pathologic evaluation. As noted touch  preps of this mass were negative. Hemostasis was assured in the wound and  the wound was packed with a saline soaked gauze.   Attention was then turned  to the right axilla, where a transverse axillary  incision was made and deepened through the skin and subcutaneous tissues,  using the Neoprobe to guide the dissection down  to the area of the sentinel  node. Entering this area radioactive counts of greater than 2000 were  encountered and a blue node was encountered. This was dissected free from  the surrounding  tissues, removed in its entirety and forwarded for  pathologic evaluation.   A 2nd node with somewhat lower counts somewhere in the range of 600 to 800  was also identified  subjacent to the first node. This was also dissected  free, maintaining hemostasis with electrocautery and using clamps and ties  of 2-0 silk. This was also forwarded for pathologic evaluation.   A 3rd lymph node which was blue and warm with measurements up to 200 was  also removed. This was sent for permanent section. The 2 nodes that were  sent down for touch prep are noted to be negative for metastatic tumor on  touch prep.   Hemostasis was achieved both within the breast and the axilla. The breast  tissues were closed in 2 layers with interrupted sutures of 3-0 Vicryl to  close the subcutaneous tissues. The skin was closed with a running suture of  5-0 Monocryl and Steri-Strips. The subcutaneous tissue in the axillary wound  was  closed with running 2-0 Vicryl suture and the skin was closed with a  running 4-0 Monocryl suture.   Sponge, instrument and sharp counts were verified for both wounds. Sterile  dressings were applied to the wounds. Anesthetic was reversed and the  patient was moved from the operating room to the recovery room in stable  condition. She tolerated  the procedure well.                                               Leonie Man, M.D.    PB/MEDQ  D:  05/31/2003  T:  05/31/2003  Job:  [id number]

## 2010-12-07 NOTE — Discharge Summary (Signed)
Kathleen [name], HOLLMAN NO.:  [id number]   MEDICAL RECORD NO.:  [id number]          PATIENT TYPE:  IPS   LOCATION:                               FACILITY:  [facility]   PHYSICIAN:  Ranelle Oyster, M.D.DATE OF BIRTH:  02-01-1961   DATE OF ADMISSION:  09/14/2004  DATE OF DISCHARGE:  09/19/2004                                 DISCHARGE SUMMARY   DISCHARGE DIAGNOSES:  1.  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  2.  Thrombocytopenia.  3.  History of lupus.  4.  History of cervical cancer, in remission.  5.  History of breast cancer, in remission.  6.  Anemia/pancytopenia.  7.  Deep venous thrombosis prophylaxis.  8.  Depression.   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  The patient was admitted on  September 11, 2004, to [facility] for right total hip arthroplasty  by Dr. Durene Romans.  Postoperative course significant for pancytopenia,  anemia, status post blood transfusion and platelet transfusion.  The patient  is placed on Coumadin for prophylaxis.  PT report at this time indicates the  patient is walking 50% minimal assist at supervision level 50 feet, and  minimal assistance with bed mobility.  The patient presently has severe  thrombocytopenia and will be monitored in rehab.  The patient was  transferred to [facility] Department on September 14, 2004, for more  therapy.   PAST MEDICAL HISTORY:  1.  Lupus.  2.  Breast cancer, in remission.  3.  Cervical cancer, in remission.  4.  Bronchitis.  5.  Depression.  6.  Heart murmur.   PAST SURGICAL HISTORY:  1.  Hysterectomy.  2.  Cholecystectomy.  3.  Right lumpectomy.  4.  Status post radiation.   FAMILY HISTORY:  Noncontributory.   SOCIAL HISTORY:   The patient lives with three children in a two level home  in [place], [place].  She was independent prior to admission.  She denies any  tobacco or alcohol use.  She was divorced.   MEDICATIONS:  1.  Zoloft 20 mg daily.  2.  Zofran.  3.  Nexium.  The patient has not been getting chemotherapy in the last five weeks.   HOSPITAL COURSE:  Ms. Kathleen [name] was admitted to [facility]  Department on September 14, 2004, for comprehensive inpatient rehabilitation  where she received more than three hours of therapy daily.  Overall, her  hospital course was significant for right total hip arthroplasty secondary  to AVN.  Overall, [name] progressed very well during her short six day  stay in rehab.  She was able to maintain up to her posture weightbearing  status.  She was discharged at overall modified independent level.  She was  able to tolerate therapy very well.  Her pain was controlled with Vicodin  p.r.n., and the  patient also received Robaxin p.r.n. for muscle spasms.  Robaxin did not manage her muscle spasms very well, therefore, Robaxin was  discontinued and she was started on Flexeril 5 mg b.i.d. on September 17, 2004.  The patient remained on Coumadin for deep venous thrombosis  prophylaxis, recommend not to have Lovenox even though the INR was  subtherapeutic.  Unfortunately, the patient was started on Lovenox per  pharmacy on September 16, 2004, which was discontinued on September 17, 2004,  due to thrombocytopenia.  There were no bleeding complications noted while  on Coumadin.  The patient is also on Os-Cal with D p.o. b.i.d.  Overall, at  time of discharge, the patient was able to ambulate approximately greater  than 200 feet modified independent with a rolling walker, able to transfer  sit to stand modified independently, bed mobility modified independently.  She was able to perform all ADL's modified independently supervision level.  The patient was discharged home with  her family to follow up with Dr. [name]  within two weeks.  #2 -  THROMBOCYTOPENIA:  At time of admission, the patient had a platelet  count of 85.  Repeat platelet count was performed on day of discharge and  was 47.  Hematology/oncology was contacted regarding platelet count.  Recommended okay to discharge the patient home and repeat platelet count on  September 24, 2004.  The patient is to follow up with Dr. Darnelle Catalan within two  weeks, continue taking Coumadin for deep venous thrombosis prophylaxis as  prescribed.  #3 -  HISTORY OF LUPUS:  The patient remained on prednisone throughout her  stay in rehab.  Initially, the patient was on 5 mg of prednisone daily.  She  was then increased to 10 mg p.o. q.a.m. as well as continued on 5 mg p.o.  q.p.m. per hematology/oncology.  The patient is to follow up with Dr.  Terri Piedra.  She had no significant lupus flare-ups while in rehab.  #4 -  ANEMIA:  She continued to take Trinsicon one tablet p.o. b.i.d.  She  also received Aranesp 400 mcg every Tuesday.  Her discharge hemoglobin was  10.6, and hematocrit was 30.6.  Follow up with hematology/oncology as needed  regarding anemia.  #5 -  HISTORY OF DEPRESSION:  Remained on Zoloft 100 mg p.o. q.h.s.  No  changes necessary in this medication.   Otherwise, besides occasional constipation, there were no other major issues  that occurred while the patient was in rehab.  She was discharged home in  modified independent level with her family.   Latest labs as stated above, and latest hemoglobin was 10, hematocrit 30.1,  white blood cell count 8.1, platelet count 47 on discharge.  The patient was  consulted by ortho prior to discharge.  Follow up with Dr. Durene Romans  within two weeks per ortho note.  Other labs showed sodium of 140, potassium  4.4, chloride 104, CO2 28, glucose 71, BUN 8, creatinine 0.7, AST 42, ALT  19, alkaline phosphatase 64.  She did have an urine culture performed on September 14, 2004, no  growth x1 day.  At the time of discharge, the  incisions showed no signs of infection.  Steri-Strips were intact.  She had  about a 1+ edema.  No significant drainage from surgical incisions.  Vitals  were otherwise stable, except her blood pressure was slightly low, but  asymptomatic.  She was discharged home with her family.   DISCHARGE MEDICATIONS:  1.  Zoloft 100 mg  p.o. q.h.s.  2.  Protonix 40 mg p.o. daily.  3.  Trinsicon one tablet p.o. b.i.d.  4.  Coumadin 10 mg p.o. q.p.m.  5.  Risperdal 0.25 mg p.o. b.i.d.  6.  Multivitamin one tablet daily.  7.  Prednisone 10 mg q.a.m. and 5 mg p.o. q.p.m.  8.  Flexeril 5 mg p.o. b.i.d.  9.  Vicodin one to two tablets q.4h. p.r.n.   DIET:  Regular.   ACTIVITY:  Continue maintaining partial weightbearing status at 50%, observe  hip precautions.   No aspirin, Aleve, or ibuprofen when on Coumadin.   FOLLOWUP:  1.  She is to follow up with Dr. Durene Romans within two weeks.  2.  Follow up with Dr. Terri Piedra within three weeks.  3.  Follow up with Dr. Darnelle Catalan in two weeks, call for appointment.  4.  She will have [facility] for physical therapy, occupational      therapy, and R.N.  5.  She will continue physical therapy and occupational therapy.  R.N. to      draw PT/INR on Coumadin as well as a CBC on September 21, 2004.  Home health      nurse to call the platelet count and hemoglobin to Dr. Darnelle Catalan on      Monday, September 24, 2004.      LB/MEDQ  D:  09/19/2004  T:  09/19/2004  Job:  [id number]   cc:   Valentino Hue. Magrinat, M.D.  [address] [facility]  [place]  [place] [address]  Fax: [phone]   Madlyn Frankel. [name], M.D.  [facility]  [address]  [address]  [place]  [place] [address]  Fax: [phone]   Elinor Parkinson. Worthy Rancher, M.D.  [address] [place]  [place] [address]  Fax: [phone]   Ranelle Oyster, M.D.  [address] [place]  [place] [address]  Fax: [phone]

## 2010-12-07 NOTE — Discharge Summary (Signed)
Kathleen [name], GORTNEY NO.:  [id number]   MEDICAL RECORD NO.:  [id number]          PATIENT TYPE:  INP   LOCATION:  1311                         FACILITY[facility]  [facility]   PHYSICIAN:  Valentino Hue. Magrinat, M.D.DATE OF BIRTH:  August 14, 1960   DATE OF ADMISSION:  03/13/2007  DATE OF DISCHARGE:  03-24-07                               DISCHARGE SUMMARY   DEATH SUMMARY:   DISCHARGE DIAGNOSES:  1. Acute myelogenous leukemia.  2. History of breast cancer, in remission,  3. History of squamous cell carcinoma of the cervix, metastatic.  4. Status post hysterectomy, status post chemotherapy and radiation      for the cancer of the cervix.  5. History of hydronephrosis requiring stenting.  6. Pancytopenia requiring transfusion.  7. Fever neutropenia by history.  8. History of tobacco abuse.  9. Status post cholecystectomy.  10.Status post tonsillectomy and adenoidectomy.  11.History of remote panic attacks.  12.History of right total hip replacement in February 2006.   PROCEDURES:  Supportive care.   HOSPITAL COURSE:  This patient with terminal AML had been offered  admission previously but wanted to stay home as long as possible to be  with her children.  On the morning of admission, the patient agreed to  be admitted after evaluation by the hospice nurse found her to be  declining very rapidly.  Dr. Arline Asp who actually did the admission (I  was out of town) found the patient to a very ill appearing and only  partially oriented.  She had multiple areas of purpura, crackles in the  lungs and bilateral lower extremity edema.  Her admitting medications  were continued including antibiotics and Amicar for the  thrombocytopenia.  The patient's Do not resuscitate order was  confirmed, and she was made as comfortable as possible.   By the next day August 23, the patient denied pain, bleeding or fever.  She was having some chills and also some mild respiratory distress which  was  relieved with O2.  Over the rest of the day, the patient's  respirations became more labored.  She became hypoxic and refused an  oxygen mask.  The family was present most of the time, and on the same  day, August 23, at 1808/12/10, the patient was pronounced with the family at  bedside.   No autopsy was obtained because of death being obvious from the  patient's diagnosis of uncontrolled AML with a white cell count of  277,000 on admission, the patient having recurred rapidly after  induction treatment a month before.      Valentino Hue. Magrinat, M.D.  Electronically Signed     GCM/MEDQ  D:  04/06/2007  T:  04/07/2007  Job:  [id number]   cc:   Lindaann Slough, M.D.  Fax: [phone]   Leonie Man, M.D.  [address]  [address]  [place]  [place] [address]   Eduardo Osier. Sharyn Lull, M.D.  Fax: [phone]   Jarome Matin, M.D.  Fax: [phone]   Kathryne Hitch, MD  Fax: [phone]   [facility]

## 2010-12-07 NOTE — Procedures (Signed)
[facility]  Patient:    Kathleen [name], Kathleen [name]                       MRN: [id number] Proc. Date: 01/31/00 Adm. Date:  [id number] Disc. Date: [id number] Attending:  Sabino Donovan CC:         Rose Phi. Myna Hidalgo, M.D.                           Procedure Report  PROCEDURE:  Video upper endoscopy and video colonoscopy.  INDICATIONS FOR PROCEDURE:  A 50 year old female with history of adenosquamous cervical cancer and family history of colon cancer whose been having epigastric pain reflux and left lower quadrant discomfort status post chemo-radiation therapy.  PREPARATION:  She is n.p.o. since clear liquid breakfast having taken Phospho-Soda prep and a clear liquid diet. The mucosa throughout is clean.  PREPROCEDURE SEDATION:  Prior to the upper endoscopy, she received a total of 70 mg of Demerol and 7 mg of Versed intravenously. In addition, her throat was anesthetized with hurricane spray and she was in 2 liters of nasal cannula O2.   DESCRIPTION OF PROCEDURE:  The Olympus video upper endoscope was inserted via the mouth and advanced easily into the descending duodenum. On withdrawal, the mucosa was carefully evaluated. The descending duodenum, bulb and entire stomach appeared normal. On retroflexed view of the gastroesophageal junction, it was apparent she had mild laxity of the lower esophageal sphincter. However, there was no evidence of esophagitis. There was a tiny cystic lesion of the distal esophagus of doubtful significance. At the conclusion of this procedure which was tolerated well, the patients position was reversed for procedure #2.  She received an additional 50 mg of  Demerol and 5 mg of Versed.  The Olympus video colonoscope was inserted via the rectum and advanced easily all the way to the cecum. Cecal landmarks were identified and photographed. On withdrawal, the mucosa was carefully evaluated and found to be entirely normal from cecum to  retroflexed view of the rectum. There was no pathology whatsoever noted. The patient tolerated the procedure well. Pulse, blood pressure and oximetry testing were stable throughout. She was observed in recovery for 45 minutes and discharged home alert with a benign abdomen.  IMPRESSION: 1. Probable reflux which seems to be controlled on her current regimen of    Nexium and Reglan. I would encourage her to use the Nexium only once    daily and the Reglan at night. 2. Probable irritable bowel type symptoms. I do not see any other pathology.  PLAN:  Return to the care of Dr. Myna Hidalgo. I will be glad to reevaluate as needed. It should also be noted that her Helicobacter pylori antibody tests returned back negative. DD:  01/31/00 TD:  01/31/00 Job: [id number]

## 2010-12-07 NOTE — H&P (Signed)
NAMEMarland Kitchen  OTILLIA, CORDONE NO.:  [id number]   MEDICAL RECORD NO.:  [id number]          PATIENT TYPE:  EMS   LOCATION:  ED                           FACILITY:  [facility]   PHYSICIAN:  Leighton Roach. Truett Perna, M.D. DATE OF BIRTH:  06/27/1961   DATE OF ADMISSION:  05/31/2006  DATE OF DISCHARGE:                              HISTORY & PHYSICAL   PATIENT IDENTIFICATION:  Kathleen [name] is a 50 year old with a history of  recurrent cervical cancer.  She is now admitted with an infection and a  fever, urinary tract infection, and pancytopenia.   HISTORY OF PRESENT ILLNESS:  (The history is obtained from the available  medical record and family.)   Kathleen [name] has a history of recurrent cervical cancer.  She is currently  maintained off of specific therapy for cervical cancer.  She is enrolled  in the [facility] program.   Kathleen [name] was found at home by her family today.  She was lethargic and  brought to the [facility] Room for evaluation.  The family  reports that she had a fever of 102 degrees approximately 1 week ago and  has felt weak for the past several days.  She had a fall 2 weeks ago.   PAST MEDICAL HISTORY:  1. Right-sided breast cancer diagnosed in November 2004, status post a      right lumpectomy followed by adjuvant AC/Taxol chemotherapy and      radiation.  2. Cervical cancer.  Initially diagnosed in August of 2002, status      post hysterectomy and radiation/cisplatin completed in October of      2002.  3. Recurrent cervical cancer diagnosed in September of 2005 involving      a left periaortic lymph node biopsy.  4. History of left-sided nephrosis, status post stent placement by Dr.      Brunilda Payor.   PAST SURGICAL HISTORY:  1. 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  2. Status post cholecystectomy.   ALLERGIES:  1. Codeine.  2. Macrobid.  3. Aspirin.  4. Pyridium.   CURRENT MEDICATIONS:  The patient brought  several bags filled with  medication bottles to the emergency room.  The patient and family are  not clear which medications she is actually taking.  We will try to  obtain the current medication list from the hospice R.N.   FAMILY HISTORY:  Noncontributory.   SOCIAL HISTORY:  She lives with 2 children in [place].  She is  enrolled the [facility] program.  A hospice nurse visits twice  per week.  She smokes cigarettes.  She denies alcohol use.  She denies  risk factors for HIV and hepatitis.  She has been transfused with red  cells in the past.   REVIEW OF SYSTEMS:  She denies fever.  RESPIRATORY:  She reports  dyspnea.  CARDIAC:  Negative.  GU:  Negative.  GI:  Negative.  NEUROLOGIC:  Negative.   PHYSICAL EXAMINATION:  VITAL SIGNS:  Blood pressure 102/56, temperature  102.8, pulse 117, oxygen saturation 100%.  HEENT:  The conjunctivae are pale.  No thrush.  LUNGS:  Inspiratory rales at the left lower chest posteriorly.  Decreased breath sounds at the right lower chest.  No respiratory  distress.  CARDIAC:  Regular rhythm.  ABDOMEN:  No hepatosplenomegaly.  EXTREMITIES:  No edema.  NEUROLOGIC:  She is lethargic, but arousable.  She appears oriented.  I  did not test her gait.   LABORATORIES:  Sodium 141, potassium 4.3, BUN 13, creatinine 1.3,  calcium 7.4, albumin 2.5. Urinalysis: 21-50 white cells, 21-50 red  cells, rare bacteria. Hemoglobin 3.4, platelets 13,000, white count 1.7.  PT 17.7 seconds, PTT 35. Chest x-ray: No definite acute infiltrate, a  low-volume film.   IMPRESSION:  1. Pancytopenia with severe anemia and thrombocytopenia.  2. History of metastatic cervical cancer.  3. History of breast cancer.  4. Lupus.  5. Fever.  6. Pyuria/hematuria, questionable urinary tract infection.  7. History of left hydronephrosis, status post stent placement.  8. Altered mental status, questionably secondary to infection in the      setting of severe anemia.    DISCUSSION/PLAN:  Kathleen [name] was brought to the emergency room after  developing lethargy at home.  She has a fever, severe anemia, and  pyuria.   She most likely has an acute infection in the setting of metastatic  cervical cancer and severe pancytopenia.  She will be admitted for  further evaluation.   The etiology of the cytopenias is not clear based on review of medical  record.  The anemia/thrombocytopenia appears to be chronic, though she  has developed severe anemia compared to the most recent CBC from the  cancer center.  When she was seen on February 25, 2006, hemoglobin returned  at 8.4 with a platelet count of 34,000 and a white count of 2 with an  ANC of 1.2.   The cytopenias may be related to ITP in the setting of SLE, bone  marrow involvement with metastatic carcinoma, or MDS related to previous  chemotherapy and radiation.   We will provide acute transfusion support tonight.  She will be placed  on broad-spectrum intravenous antibiotic therapy.   I discussed CPR/ACLS issues with Kathleen [name] and her family.  She  indicated she has been placed on a no Code Blue status.      Leighton Roach Truett Perna, M.D.  Electronically Signed     GBS/MEDQ  D:  05/31/2006  T:  05/31/2006  Job:  [id number]   cc:   Valentino Hue. Magrinat, M.D.  Fax: [phone]   [facility]

## 2010-12-07 NOTE — Consult Note (Signed)
[facility]  Patient:    Kathleen [name], Kathleen [name] Visit Number: [id number] MRN: [id number]          Service Type: GON Location: GYN Attending Physician:  Sabino Donovan Proc. Date: 03/11/01 Adm. Date:  03/11/2001   CC:         [name], M.D.  Telford Nab, R.N.   Consultation Report  DATE OF BIRTH:  1960/12/29  HISTORY OF PRESENT ILLNESS AND CHIEF COMPLAINT:  This 50 year old woman returns for ongoing followup of cervical cancer, with a chief complaint of hot flashes.  Patient had a cut-through hysterectomy followed by chemoirradiation, completing treatment in October 2000, and has been followed without evidence of disease.  At the time of last followup, Premarin dose was increased to 0.9 mg.  INTERVAL NOTE:  She continues to do well, having frequent anxiety.  She has had less fluid retention but still complains of hot flashes.  She denies back or pelvic pain, pressure, vaginal bleeding or discharge.  She denies leg swelling or adenopathy.  CURRENT MEDICATIONS:  Premarin 0.9 mg.  ALLERGIES:  ASPIRIN, CODEINE and PYRIDIUM.  Personal and social history, family history and review of systems are otherwise unchanged.  The patient specifically has no GI, GU or pelvic complaints, musculoskeletal problems or leg swelling.  PHYSICAL EXAMINATION:  VITAL SIGNS:  Stable and afebrile.  Blood pressure 120/80.  GENERAL:  Patient is alert and oriented x 3, in no acute distress.  ENT:  Benign with clear oropharynx.  NECK:  Supple without goiter.  NODES:  There is no pathologic lymphadenopathy.  LUNGS:  Lung fields are clear.  BACK:  There is no back of CVA tenderness.  ABDOMEN:  Soft and nontender without ascites, mass or hernia.  Incision is well-healed and there is no tenderness.  PELVIC:  External genitalia and BUS are normal to inspection and palpation. The vagina is well-supported without mucosal lesions.  Bimanual  and rectovaginal examinations reveal no mass, absent uterus and cervix.  IMPRESSION: 1. Stage IB1 squamous carcinoma of the cervix, no active disease. 2. Hormonal replacement issues.  PLAN:  Pap smear repeated and will be communicated to patient.  We will increase Premarin to 1.25 mg daily.  The patient wishes to begin seeing Dr. Erie Noe P. Haygood and we will let her make these arrangements.  We should continue to see the patient on an annual basis. Attending Physician:  Ronita Hipps T DD:  03/11/01 TD:  03/12/01 Job: [id number]

## 2010-12-07 NOTE — Consult Note (Signed)
[name], [name] NO.:  [id number]   MEDICAL RECORD NO.:  [id number]          PATIENT TYPE:  INP   LOCATION:  0453                         FACILITY:  [facility]   PHYSICIAN:  Madlyn Frankel. [name], M.D.  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   HISTORY:  Ms. Kathleen [name] is a very pleasant 50 year old black female with an  extensive medical history that includes lupus with extensive steroid use,  history of breast adenocarcinoma, as well as metastatic cervical  adenosquamous carcinoma.  She was recently admitted the day prior to this  evaluation by Dr. Marikay Alar Magrinat secondary to increasing and progressively  worsening right hip pain.  The patient lives at home with her children and  got to the point where she is unable to care for herself due to this pain.  An MRI was ordered on September 01, 2004, which indicated extensive femoral  head AVN on the right hip with a much smaller lesion on the left hip.  The  left hip was not bothering her nearly as bad as the right hip.  She was very  limited in her activities secondary to her pain and was subsequently  admitted for pain control secondary to the inability for her to take care of  herself or her kids.   PAST MEDICAL HISTORY:  1.  Breast cancer, but it is in remission.  2.  Cervical cancer that is recently controlled without a problem.  3.  She has a history of osteoporosis.  4.  History of bronchitis.   PAST SURGICAL HISTORY:  1.  Cholecystectomy.  2.  History of tonsillectomy.  3.  History of adenoidectomy.   DRUG ALLERGIES:  1.  ASPIRIN.  2.  CODEINE.  3.  PYRIDIUM.   MEDICATIONS AT HOME:  Zoloft, diazepam, Risperdal, Zofran, Compazine,  Nexium, calcium with vitamin D, Norvasc, oxycodone, and Warfarin.  He is  also reported for being on  prednisone 7.5 mg daily.   Remainder of her review of systems and social history can be found in the  admitting H&P.   PHYSICAL EXAMINATION:  GENERAL:  Examination reveals an otherwise healthy-  appearing, 50 year old, black female, who is in no acute distress, supine on  her bed without activity.  She is very cooperative, awake, alert, and  oriented.  LOWER EXTREMITIES:  She tolerates hip range of motion okay.  She states that  most of her discomfort has been weightbearing and just generalized  discomfort.  She did not have a limited range of motion of the right hip  compared to the left other than with internal rotation and flexion,  indicating perhaps some synovitis.  She has palpable pulses, intact motor  and sensory function distally.  She has normal reflexes bilaterally.   RADIOGRAPHS:  The MRI that was ordered on September 01, 2004, was reviewed,  indicating extensive avascular necrosis involving the femoral head.  There  appeared to be no significant involvement of the acetabulum per MRI.  Left  hip indicated that there was a small lesion within the femoral head.   ASSESSMENT:  Right hip severe near 100% femoral head involvement with  avascular necrosis.   PLAN:  Reviewed with Ms. Kathleen [name] and Dr. Darnelle Catalan her current situation.  Given her history, she presents with quite a challenge.  She does have a  history of thrombocytopenia and for this reason, we will try to schedule  this for later, probably a week.  She should have platelets available in an  effort to try to elevate her platelets to a level that would be tolerable  from a surgical standpoint which is 80,000-100,000.  We will plan on  obtaining plain radiographs for preoperative templating evaluation.  I have  discussed with [name] the risks and benefits of the current treatment  options available which is hemi versus total hip replacement.  Total hip  replacement offers [place] the best long-term possibility of  survivability of the  prosthesis, compared to hemiarthroplasty which may fail within 7 years or  so.  I told her that I would plan on evaluating her acetabular cartilage as  well as the femoral head anatomy to see whether or not there was a reason to  proceed with one or the other.  Given her age, I had planned on performing a  hard, barren surface whether or not it was ceramic-on-ceramic or metal-on-  metal in an effort to provide the greatest chance of longevity as possible.  Risks and benefits will be reviewed.  Consent will be obtained at the time  of surgery along with arranging for her platelet level to be at a normal  level.  She will require stress dose of steroids and then will be placed on  Lovenox perioperatively followed by placement on Coumadin postoperatively.      MDO/MEDQ  D:  09/11/2004  T:  09/11/2004  Job:  [id number]

## 2010-12-07 NOTE — Consult Note (Signed)
NAME:  Kathleen [name], Kathleen [name] NO.:  [id number]   MEDICAL RECORD NO.:  [id number]                   PATIENT TYPE:  OUT   LOCATION:  GYN                                  FACILITY:  [facility]   PHYSICIAN:  De Blanch, M.D.         DATE OF BIRTH:  1961-02-23   DATE OF CONSULTATION:  09/14/2002  DATE OF DISCHARGE:                                   CONSULTATION   A 50 year old black female returns today for continuing follow-up of  cervical cancer.  She was treated with chemotherapy combined with radiation  therapy which was completed in October of 2002.   Since her last visit she has done well.  She denies any GI or GU symptoms.  Has no pelvic pain, pressure, vaginal bleeding or discharge.  She does feel  like she is depressed and is scheduled to see her primary care physician  regarding this.   The patient's specifically denies any GI or GU symptoms.  Has no pelvic  pain, pressure, vaginal bleeding or discharge.   PAST MEDICAL HISTORY:  None.   PAST SURGICAL HISTORY:  Hysterectomy and pelvic radiation therapy.   CURRENT MEDICATIONS:  1. Premarin 1.25 mg daily.  2. Premarin vaginal cream twice a week.   ALLERGIES:  ASPIRIN, CODEINE, PYRIDIUM.   SOCIAL HISTORY:  Unchanged from previous evaluations.   REVIEW OF SYSTEMS:  No cardiovascular, pulmonary, GI, GU, musculoskeletal,  or neurologic symptoms.   PHYSICAL EXAMINATION:  VITAL SIGNS:  Weight 194 pounds, blood pressure  110/78.  GENERAL:  The patient is a healthy black female in no acute distress.  HEENT:  Negative.  NECK:  Supple without thyromegaly.  LYMPH:  There is no supraclavicular or inguinal adenopathy.  ABDOMEN:  Soft, nontender.  No masses, organomegaly, ascites, or hernias are  noted.  PELVIC:  EGBUS, vagina, bladder, urethra are normal.  Bimanual and  rectovaginal examinations reveal no induration, masses, or nodularity.   IMPRESSION:  Early stage cervical cancer status post  cut-through  hysterectomy followed by radiation therapy and chemotherapy completed  October 2002.  The patient is clinically free of disease.    PLAN:  Pap smears are obtained today.  She will return to see Hal Morales, M.D. in six months and return to see [place] in one year.                                               De Blanch, M.D.    DC/MEDQ  D:  09/15/2002  T:  09/15/2002  Job:  [id number]   cc:   Hal Morales, M.D.  [address], [address]  [place]  [place] [address]  Fax: [phone]   Althea Grimmer. Luther Parody, M.D.  [address]  [place]  [place] [address]  Fax:  [phone]   Telford Nab, R.N.

## 2010-12-07 NOTE — Consult Note (Signed)
[facility]  Patient:    Kathleen [name], [name] Visit Number: [id number] MRN: [id number]          Service Type: GON Location: GYN Attending Physician:  Sabino Donovan Dictated by:   Jackquline Denmark. Kyla Balzarine, M.D. Proc. Date: 06/10/01 Admit Date:  06/10/2001   CC:         Kathleen [name], M.D.   Consultation Report  REASON FOR VISIT:  Kathleen [name] returns for ongoing follow-up of cervical carcinoma.  INTERVAL HISTORY:  The patient returns early because of most recent cytology in August 2002 revealing slight squamous atypia.  She has had an improvement of hot flashes while on Premarin.  Anxiety has improved, and she has begun working.  She denies leg swelling, adenopathy, pelvic pain, pressure, vaginal bleeding or discharge.  HISTORY OF PRESENT ILLNESS:  The patient had a cut through hysterectomy followed by chemoradiation, completing treatment in October 2000.  She been followed without evidence of disease.  PAST MEDICAL HISTORY:  No major comorbidity.  PAST SURGICAL HISTORY:  Prior simple hysterectomy as above.  MEDICATIONS:  Premarin 1.25 mg daily.  ALLERGIES:  ASPIRIN, CODEINE AND PYRIDIUM.  PERSONAL SOCIAL HISTORY/FAMILY HISTORY/REVIEW OF SYSTEMS:  Otherwise unchanged.  PHYSICAL EXAMINATION: VITAL SIGNS:  Stable and afebrile with weight 185.5 pounds and blood pressure 120/80.  GENERAL:  The patient is alert and oriented x 3 in no acute distress.  LYMPHATIC:  There is no pathologic adenopathy.  ABDOMEN:  Soft and benign without ascites, mass, organomegaly or hernia.  EXTREMITIES:  The extremities have full strength and range of motion without edema.  PELVIC:  External genitalia and BUS are normal to inspection and palpation. The bladder and urethra are well supported without vaginal mucosal lesions. The cervix and uterus are absent.  Bimanual and rectovaginal examinations reveal no adnexal or parametrial mass or nodularity.  ASSESSMENT:  Slightly  abnormal Pap smear related to probable radiation effect and vaginal atrophy.  Cervical carcinoma, clinically no active disease.  PLAN:  Continue Premarin.  Cytology repeated, and if there is no evidence of squamous dysplasia, we could begin alternating follow-up at six month interval with Dr. Pennie [name] such that we see her on an annual basis. Dictated by:   Jackquline Denmark. Kyla Balzarine, M.D. Attending Physician:  Kathleen [name] T DD:  06/10/01 TD:  06/10/01 Job: [id number]

## 2010-12-07 NOTE — Op Note (Signed)
NAMEJAELENE, GARCIAGARCIA NO.:  [id number]   MEDICAL RECORD NO.:  [id number]          PATIENT TYPE:  AMB   LOCATION:  DSC                          FACILITY:  [facility]   PHYSICIAN:  Leonie Man, M.D.   DATE OF BIRTH:  28-Dec-1960   DATE OF PROCEDURE:  05/03/2005  DATE OF DISCHARGE:                                 OPERATIVE REPORT   PREOPERATIVE DIAGNOSIS:  Infected Port-A-Cath.   POSTOPERATIVE DIAGNOSIS:  Infected Port-A-Cath.   PROCEDURE:  Removal of infected Port-A-Cath.   SURGEON:  Mardene Celeste. Lurene Shadow, M.D.   ASSISTANT:  Nurse.   ANESTHESIA:  MAC, I used 1% Xylocaine with 1:100,000 epinephrine.   NOTE:  Ms. Ek is a 50 year old patient who is undergoing chemotherapy  for carcinoma of the right breast.  She presents with an infected Port-A-  Cath.  The patient comes to the operating room now for Port-A-Cath removal.   DESCRIPTION OF PROCEDURE:  With the patient positioned supine and following  adequate sedation, the anterior chest wall on the left was prepped and  draped to be included in the sterile operative field.  I then infiltrated  the region around the Port-A-Cath with 1% Xylocaine with 1:100,000  epinephrine.  An elliptical incision is made over the old incision site and  deepened through the skin and subcutaneous tissue and dissection was carried  down to the pocket.  The Port-A-Cath is extruded from the pocket and the  sutures holding the Port-A-Cath in place are cut.  The Port-A-Cath is  removed in its entirety along with the Silastic catheter.  The tunnel was  closed with a 3-0 Vicryl suture.  The wound and the Port-A-Cath are both  cultured and the wound is irrigated.  I then left two Telfa wicks within the  wound and closed the wound very loosely with 4-0 Monocryl suture, placing on  three sutures in the wound.  A sterile dressing was then applied to the  wound, the anesthetic reversed, and the patient removed from the operating  room to the  recovery room in stable condition, having tolerated the  procedure well.      Leonie Man, M.D.  Electronically Signed     PB/MEDQ  D:  05/03/2005  T:  05/03/2005  Job:  [id number]

## 2011-05-03 LAB — DIFFERENTIAL
Basophils Relative: 0
Eosinophils Absolute: 0
Eosinophils Relative: 0
Lymphocytes Relative: 6 — ABNORMAL LOW
Lymphs Abs: 16.6 — ABNORMAL HIGH
Myelocytes: 0
Neutro Abs: 8.3 — ABNORMAL HIGH
Neutrophils Relative %: 3 — ABNORMAL LOW
Promyelocytes Absolute: 0
nRBC: 0

## 2011-05-03 LAB — CULTURE, BLOOD (ROUTINE X 2): Culture: NO GROWTH

## 2011-05-03 LAB — CBC
HCT: 20.1 — ABNORMAL LOW
Hemoglobin: UNDETERMINED
RBC: 2 — ABNORMAL LOW
WBC: 277.4

## 2011-05-03 LAB — PATHOLOGIST SMEAR REVIEW

## 2011-05-03 LAB — COMPREHENSIVE METABOLIC PANEL
Albumin: 2.2 — ABNORMAL LOW
Alkaline Phosphatase: 97
BUN: 55 — ABNORMAL HIGH
Creatinine, Ser: 5.87 — ABNORMAL HIGH
Glucose, Bld: 160 — ABNORMAL HIGH
Potassium: 5.2 — ABNORMAL HIGH
Total Protein: 7.7

## 2011-05-06 LAB — CROSSMATCH
ABO/RH(D): O POS
DAT, IgG: NEGATIVE
Donor AG Type: NEGATIVE
Donor AG Type: NEGATIVE

## 2011-05-07 LAB — CLOSTRIDIUM DIFFICILE EIA

## 2011-05-07 LAB — COMPREHENSIVE METABOLIC PANEL
ALT: 11
ALT: 12
AST: 12
AST: 12
AST: 13
AST: 16
AST: 8
Albumin: 2 — ABNORMAL LOW
Albumin: 2.2 — ABNORMAL LOW
Albumin: 2.4 — ABNORMAL LOW
Alkaline Phosphatase: 47
Alkaline Phosphatase: 51
BUN: 10
BUN: 5 — ABNORMAL LOW
BUN: 6
BUN: 7
CO2: 31
CO2: 32
Calcium: 7.9 — ABNORMAL LOW
Calcium: 8.1 — ABNORMAL LOW
Calcium: 8.3 — ABNORMAL LOW
Calcium: 8.5
Calcium: 8.8
Calcium: 9.1
Chloride: 100
Chloride: 102
Chloride: 108
Creatinine, Ser: 0.58
Creatinine, Ser: 0.65
Creatinine, Ser: 0.73
Creatinine, Ser: 0.77
Creatinine, Ser: 0.79
Creatinine, Ser: 0.83
GFR calc Af Amer: >60
GFR calc Af Amer: >60
GFR calc Af Amer: >60
GFR calc Af Amer: >60
GFR calc Af Amer: >60
GFR calc non Af Amer: >60
GFR calc non Af Amer: >60
GFR calc non Af Amer: >60
GFR calc non Af Amer: >60
Glucose, Bld: 83
Glucose, Bld: 90
Glucose, Bld: 92
Glucose, Bld: 97
Potassium: 3.6
Potassium: 3.7
Sodium: 137
Sodium: 139
Sodium: 141
Total Bilirubin: 0.9
Total Bilirubin: 1.7 — ABNORMAL HIGH
Total Protein: 5.6 — ABNORMAL LOW
Total Protein: 5.9 — ABNORMAL LOW
Total Protein: 6.3
Total Protein: 6.4
Total Protein: 6.7

## 2011-05-07 LAB — PREPARE PLATELET PHERESIS

## 2011-05-07 LAB — HEPATIC FUNCTION PANEL
Bilirubin, Direct: 0.3
Indirect Bilirubin: 1.3 — ABNORMAL HIGH

## 2011-05-07 LAB — BASIC METABOLIC PANEL
BUN: 4 — ABNORMAL LOW
BUN: 7
CO2: 27
CO2: 31
Chloride: 101
Chloride: 114 — ABNORMAL HIGH
Creatinine, Ser: 0.6
GFR calc Af Amer: >60
GFR calc non Af Amer: >60
Glucose, Bld: 84
Potassium: 3.3 — ABNORMAL LOW
Potassium: 5.5 — ABNORMAL HIGH
Sodium: 140

## 2011-05-07 LAB — CBC
HCT: 21.3 — ABNORMAL LOW
HCT: 21.6 — ABNORMAL LOW
HCT: 22.6 — ABNORMAL LOW
HCT: 23.9 — ABNORMAL LOW
HCT: 24.5 — ABNORMAL LOW
HCT: 27.6 — ABNORMAL LOW
HCT: 27.9 — ABNORMAL LOW
Hemoglobin: 7.6 — CL
Hemoglobin: 7.6 — CL
Hemoglobin: 8.4 — ABNORMAL LOW
Hemoglobin: 8.6 — ABNORMAL LOW
Hemoglobin: 9 — ABNORMAL LOW
Hemoglobin: 9.7 — ABNORMAL LOW
Hemoglobin: 9.9 — ABNORMAL LOW
MCHC: 34.9
MCHC: 35
MCHC: 35.3
MCHC: 35.3
MCHC: 35.4
MCHC: 35.5
MCHC: 35.6
MCHC: 35.8
MCV: 86.6
MCV: 86.8
MCV: 86.9
MCV: 86.9
MCV: 87.1
MCV: 87.9
MCV: 88.1
Platelets: 10 — CL
Platelets: 13 — CL
Platelets: 5 — CL
Platelets: 7 — CL
Platelets: 8 — CL
RBC: 2.48 — ABNORMAL LOW
RBC: 2.61 — ABNORMAL LOW
RBC: 2.72 — ABNORMAL LOW
RBC: 2.94 — ABNORMAL LOW
RDW: 13.8
RDW: 14.1 — ABNORMAL HIGH
RDW: 14.2 — ABNORMAL HIGH
RDW: 14.3 — ABNORMAL HIGH
RDW: 14.3 — ABNORMAL HIGH
RDW: 14.6 — ABNORMAL HIGH
RDW: 14.8 — ABNORMAL HIGH
WBC: 0.6 — CL
WBC: 0.6 — CL
WBC: 0.7 — CL
WBC: 1.6 — ABNORMAL LOW
WBC: 2.7 — ABNORMAL LOW
WBC: 4.5

## 2011-05-07 LAB — DIFFERENTIAL
Basophils Absolute: 0
Eosinophils Absolute: 0
Lymphocytes Relative: 0 — ABNORMAL LOW
Monocytes Absolute: 0 — ABNORMAL LOW
Monocytes Relative: 0 — ABNORMAL LOW
nRBC: 0

## 2011-05-07 LAB — CROSSMATCH
ABO/RH(D): O POS
Antibody Screen: POSITIVE
Donor AG Type: NEGATIVE

## 2011-05-07 LAB — CULTURE, BLOOD (ROUTINE X 2)
Culture: NO GROWTH
Culture: NO GROWTH

## 2011-05-07 LAB — VANCOMYCIN, TROUGH
Vancomycin Tr: 18.9
Vancomycin Tr: 9.9

## 2011-05-07 LAB — RETICULOCYTES
RBC.: 2.55 — ABNORMAL LOW
Retic Ct Pct: 0 — ABNORMAL LOW

## 2011-05-07 LAB — URIC ACID: Uric Acid, Serum: 1.6 — ABNORMAL LOW

## 2011-05-08 LAB — DIFFERENTIAL
Band Neutrophils: 0
Band Neutrophils: 0
Basophils Absolute: 0
Basophils Absolute: 0
Basophils Absolute: 0
Basophils Relative: 0
Basophils Relative: 0
Basophils Relative: 0
Blasts: 11
Blasts: 11
Blasts: 60
Blasts: 8
Blasts: 84
Eosinophils Absolute: 0
Eosinophils Absolute: 0
Eosinophils Relative: 0
Eosinophils Relative: 0
Eosinophils Relative: 0
Lymphocytes Relative: 13
Lymphocytes Relative: 18
Lymphocytes Relative: 32
Lymphocytes Relative: 50 — ABNORMAL HIGH
Lymphs Abs: 2.8
Lymphs Abs: 3.9 — ABNORMAL HIGH
Metamyelocytes Relative: 0
Metamyelocytes Relative: 0
Monocytes Absolute: 0.4
Monocytes Relative: 2 — ABNORMAL LOW
Monocytes Relative: 30 — ABNORMAL HIGH
Monocytes Relative: 4
Monocytes Relative: 42 — ABNORMAL HIGH
Monocytes Relative: 76 — ABNORMAL HIGH
Myelocytes: 0
Myelocytes: 0
Myelocytes: 0
Neutro Abs: 0.6 — ABNORMAL LOW
Neutro Abs: 4.2
Neutrophils Relative %: 0 — ABNORMAL LOW
Neutrophils Relative %: 0 — ABNORMAL LOW
Neutrophils Relative %: 1 — ABNORMAL LOW
Neutrophils Relative %: 2 — ABNORMAL LOW
Neutrophils Relative %: 4 — ABNORMAL LOW
Promyelocytes Absolute: 0
Promyelocytes Absolute: 0
Promyelocytes Absolute: 0
Promyelocytes Absolute: 0
Promyelocytes Absolute: 0
nRBC: 0
nRBC: 0
nRBC: 0
nRBC: 0

## 2011-05-08 LAB — COMPREHENSIVE METABOLIC PANEL
ALT: 23
ALT: 32
ALT: 42 — ABNORMAL HIGH
AST: 121 — ABNORMAL HIGH
AST: 35
AST: 95 — ABNORMAL HIGH
Albumin: 2.5 — ABNORMAL LOW
Albumin: 2.6 — ABNORMAL LOW
Albumin: 2.6 — ABNORMAL LOW
Albumin: 2.6 — ABNORMAL LOW
Alkaline Phosphatase: 47
Alkaline Phosphatase: 54
BUN: 4 — ABNORMAL LOW
BUN: 4 — ABNORMAL LOW
BUN: 9
CO2: 28
CO2: 31
CO2: 31
Calcium: 7.8 — ABNORMAL LOW
Calcium: 8.1 — ABNORMAL LOW
Calcium: 8.4
Chloride: 100
Chloride: 103
Creatinine, Ser: 0.84
Creatinine, Ser: 0.89
Creatinine, Ser: 0.89
Creatinine, Ser: 1.33 — ABNORMAL HIGH
GFR calc Af Amer: 52 — ABNORMAL LOW
GFR calc Af Amer: >60
GFR calc Af Amer: >60
GFR calc Af Amer: >60
GFR calc non Af Amer: 43 — ABNORMAL LOW
GFR calc non Af Amer: >60
GFR calc non Af Amer: >60
Glucose, Bld: 112 — ABNORMAL HIGH
Glucose, Bld: 89
Glucose, Bld: 97
Potassium: 3.6
Potassium: 3.9
Sodium: 139
Sodium: 140
Sodium: 141
Sodium: 141
Sodium: 143
Total Bilirubin: 0.8
Total Bilirubin: 1
Total Protein: 6.2
Total Protein: 6.3
Total Protein: 6.3
Total Protein: 6.5
Total Protein: 7.8

## 2011-05-08 LAB — CBC
HCT: 24.6 — ABNORMAL LOW
HCT: 26.8 — ABNORMAL LOW
HCT: 27.7 — ABNORMAL LOW
HCT: 28.3 — ABNORMAL LOW
HCT: 28.3 — ABNORMAL LOW
HCT: 30.9 — ABNORMAL LOW
HCT: 31.1 — ABNORMAL LOW
Hemoglobin: 10.5 — ABNORMAL LOW
Hemoglobin: 10.9 — ABNORMAL LOW
Hemoglobin: 8.4 — ABNORMAL LOW
Hemoglobin: 8.8 — ABNORMAL LOW
Hemoglobin: 9.2 — ABNORMAL LOW
Hemoglobin: 9.4 — ABNORMAL LOW
Hemoglobin: 9.9 — ABNORMAL LOW
MCHC: 33.8
MCHC: 34.2
MCHC: 34.2
MCHC: 34.9
MCHC: 35.1
MCHC: 35.1
MCV: 85.4
MCV: 85.5
MCV: 85.6
MCV: 85.9
MCV: 86
MCV: 87.2
Platelets: 13 — CL
Platelets: 16 — CL
Platelets: 24 — CL
Platelets: 6 — CL
Platelets: 6 — CL
Platelets: 8 — CL
Platelets: 9 — CL
Platelets: <5 — CL
Platelets: <5 — CL
RBC: 2.57 — ABNORMAL LOW
RBC: 3.03 — ABNORMAL LOW
RBC: 3.24 — ABNORMAL LOW
RBC: 3.59 — ABNORMAL LOW
RBC: 3.75 — ABNORMAL LOW
RDW: 15.3 — ABNORMAL HIGH
RDW: 15.4 — ABNORMAL HIGH
RDW: 17 — ABNORMAL HIGH
RDW: 17.2 — ABNORMAL HIGH
RDW: 17.4 — ABNORMAL HIGH
RDW: 17.6 — ABNORMAL HIGH
RDW: 17.7 — ABNORMAL HIGH
RDW: 17.9 — ABNORMAL HIGH
RDW: 18 — ABNORMAL HIGH
WBC: 0.8 — CL
WBC: 15 — ABNORMAL HIGH
WBC: 2.3 — ABNORMAL LOW
WBC: 21.9 — ABNORMAL HIGH
WBC: 32.4 — ABNORMAL HIGH
WBC: 6.6

## 2011-05-08 LAB — TYPE AND SCREEN
ABO/RH(D): O POS
DAT, IgG: NEGATIVE
Donor AG Type: NEGATIVE
Donor AG Type: NEGATIVE

## 2011-05-08 LAB — PREPARE PLATELET PHERESIS

## 2011-05-08 LAB — LACTATE DEHYDROGENASE
LDH: 319 — ABNORMAL HIGH
LDH: 332 — ABNORMAL HIGH
LDH: 401 — ABNORMAL HIGH
LDH: 484 — ABNORMAL HIGH

## 2011-05-08 LAB — CROSSMATCH
ABO/RH(D): O POS
DAT, IgG: NEGATIVE

## 2011-05-08 LAB — URINALYSIS, ROUTINE W REFLEX MICROSCOPIC
Bilirubin Urine: NEGATIVE
Bilirubin Urine: NEGATIVE
Glucose, UA: NEGATIVE
Ketones, ur: NEGATIVE
Leukocytes, UA: NEGATIVE
Leukocytes, UA: NEGATIVE
Leukocytes, UA: NEGATIVE
Nitrite: NEGATIVE
Nitrite: NEGATIVE
Nitrite: NEGATIVE
Protein, ur: 100 — AB
Protein, ur: 30 — AB
Specific Gravity, Urine: 1.009
Specific Gravity, Urine: 1.013
Urobilinogen, UA: 0.2
Urobilinogen, UA: 1
pH: 8
pH: 8

## 2011-05-08 LAB — URINE CULTURE

## 2011-05-08 LAB — BASIC METABOLIC PANEL
BUN: 14
BUN: 7
CO2: 30
CO2: 32
Calcium: 7.5 — ABNORMAL LOW
Calcium: 8.3 — ABNORMAL LOW
Chloride: 102
Creatinine, Ser: 0.57
Creatinine, Ser: 0.62
GFR calc Af Amer: >60
GFR calc Af Amer: >60
GFR calc non Af Amer: >60
GFR calc non Af Amer: >60
GFR calc non Af Amer: >60
Glucose, Bld: 117 — ABNORMAL HIGH
Glucose, Bld: 83
Glucose, Bld: 88
Glucose, Bld: 88
Potassium: 3.8
Potassium: 3.9
Sodium: 141
Sodium: 142

## 2011-05-08 LAB — PREPARE RBC (CROSSMATCH)

## 2011-05-08 LAB — APTT: aPTT: 32

## 2011-05-08 LAB — CULTURE, BLOOD (ROUTINE X 2): Culture: NO GROWTH

## 2011-05-08 LAB — RETICULOCYTES: Retic Count, Absolute: 3.2 — ABNORMAL LOW

## 2011-05-08 LAB — CLOSTRIDIUM DIFFICILE EIA

## 2011-05-08 LAB — PATHOLOGIST SMEAR REVIEW

## 2011-05-08 LAB — PROTIME-INR
INR: 1.1
Prothrombin Time: 14.2

## 2011-05-08 LAB — URIC ACID: Uric Acid, Serum: 2.6

## 2011-05-08 LAB — URINE MICROSCOPIC-ADD ON

## 2011-05-09 LAB — PREPARE PLATELET PHERESIS

## 2011-05-09 LAB — CROSSMATCH
ABO/RH(D): O POS
DAT, IgG: NEGATIVE
Donor AG Type: NEGATIVE
Donor AG Type: NEGATIVE

## 2011-05-09 LAB — CBC
MCHC: 34.6
MCV: 85.4
Platelets: 10 — CL
RBC: 3.35 — ABNORMAL LOW
RDW: 17.4 — ABNORMAL HIGH

## 2011-05-09 LAB — PATHOLOGIST SMEAR REVIEW
